# Patient Record
Sex: Male | Born: 1965
Health system: Southern US, Community
[De-identification: ages and names within clinical notes are randomized; demographics above are authoritative.]

## PROBLEM LIST (undated history)

## (undated) DIAGNOSIS — J45909 Unspecified asthma, uncomplicated: Secondary | ICD-10-CM

## (undated) DIAGNOSIS — T7840XA Allergy, unspecified, initial encounter: Secondary | ICD-10-CM

## (undated) DIAGNOSIS — F32A Depression, unspecified: Secondary | ICD-10-CM

## (undated) HISTORY — DX: Depression, unspecified: F32.A

## (undated) HISTORY — DX: Unspecified asthma, uncomplicated: J45.909

## (undated) HISTORY — DX: Allergy, unspecified, initial encounter: T78.40XA

---
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
## 1995-12-24 DIAGNOSIS — S060XAA Concussion with loss of consciousness status unknown, initial encounter: Secondary | ICD-10-CM

## 1995-12-24 DIAGNOSIS — S060X9A Concussion with loss of consciousness of unspecified duration, initial encounter: Secondary | ICD-10-CM

## 1995-12-24 HISTORY — DX: Concussion with loss of consciousness of unspecified duration, initial encounter: S06.0X9A

## 1995-12-24 HISTORY — DX: Concussion with loss of consciousness status unknown, initial encounter: S06.0XAA

## 2004-12-23 HISTORY — PX: CHOLECYSTECTOMY: SHX55

## 2015-12-24 DIAGNOSIS — K5792 Diverticulitis of intestine, part unspecified, without perforation or abscess without bleeding: Secondary | ICD-10-CM

## 2015-12-24 HISTORY — DX: Diverticulitis of intestine, part unspecified, without perforation or abscess without bleeding: K57.92

## 2020-09-25 ENCOUNTER — Encounter: Payer: Self-pay | Admitting: Physician Assistant

## 2020-09-25 ENCOUNTER — Ambulatory Visit (INDEPENDENT_AMBULATORY_CARE_PROVIDER_SITE_OTHER): Payer: 59 | Admitting: Physician Assistant

## 2020-09-25 ENCOUNTER — Other Ambulatory Visit: Payer: Self-pay

## 2020-09-25 VITALS — BP 118/80 | HR 60 | Temp 98.3°F | Resp 20 | Ht 69.0 in | Wt 172.4 lb

## 2020-09-25 DIAGNOSIS — Z23 Encounter for immunization: Secondary | ICD-10-CM

## 2020-09-25 DIAGNOSIS — H9193 Unspecified hearing loss, bilateral: Secondary | ICD-10-CM | POA: Diagnosis not present

## 2020-09-25 DIAGNOSIS — K834 Spasm of sphincter of Oddi: Secondary | ICD-10-CM | POA: Diagnosis not present

## 2020-09-25 DIAGNOSIS — Z9049 Acquired absence of other specified parts of digestive tract: Secondary | ICD-10-CM | POA: Diagnosis not present

## 2020-09-25 DIAGNOSIS — J310 Chronic rhinitis: Secondary | ICD-10-CM

## 2020-09-25 MED ORDER — FLUTICASONE PROPIONATE 50 MCG/ACT NA SUSP: 2.0000 | Freq: Every day | NASAL | 6 refills | Status: AC

## 2020-09-25 NOTE — Progress Notes (Signed)
Patient presents to clinic today to establish care.  Acute Concerns: Patient endorses decreased hearing bilaterally with left greater than right.  Has been ongoing for some time but has been gradually worsening.  Denies any trauma or injury.  Occasionally will notice some mild tinnitus.  Denies ear pressure or popping but does have chronic seasonal rhinitis.  He takes OTC fexofenadine for this.  Patient denies any ear pain, swelling or drainage.  Denies fever, chills, malaise or fatigue.  Does note history of loud noise exposure.  Patient also would like to discuss shingles vaccine today.  Health Maintenance: Immunizations --due for shingles vaccine. Colonoscopy --up-to-date  History reviewed. No pertinent past medical history.  Past Surgical History:  Procedure Laterality Date  . APPENDECTOMY  1984  . CHOLECYSTECTOMY  2006  . concession Bilateral 1997  . diverticulitis Bilateral 2017    Current Outpatient Medications on File Prior to Visit  Medication Sig Dispense Refill  . cholestyramine (QUESTRAN) 4 GM/DOSE powder Take by mouth 3 (three) times daily with meals.    Marland Kitchen escitalopram (LEXAPRO) 20 MG tablet Take 20 mg by mouth daily.    . fexofenadine (ALLEGRA) 60 MG tablet Take 60 mg by mouth 2 (two) times daily.     No current facility-administered medications on file prior to visit.    Not on File  Family History  Problem Relation Age of Onset  . Hearing loss Mother   . Depression Father   . Heart disease Father   . Asthma Sister   . Learning disabilities Sister     Social History   Socioeconomic History  . Marital status: Married    Spouse name: Baxter Hire  . Number of children: 3  . Years of education: Not on file  . Highest education level: Master's degree (e.g., MA, MS, MEng, MEd, MSW, MBA)  Occupational History  . Not on file  Tobacco Use  . Smoking status: Light Tobacco Smoker  . Tobacco comment: occasional holiday smoker   Substance and Sexual Activity    . Alcohol use: Yes    Alcohol/week: 6.0 standard drinks    Types: 2 Glasses of wine, 4 Cans of beer per week    Comment: two glasses of wine a week and 4 to 6 beers a week  . Drug use: Never  . Sexual activity: Yes    Comment: married  Other Topics Concern  . Not on file  Social History Narrative  . Not on file   Social Determinants of Health   Financial Resource Strain:   . Difficulty of Paying Living Expenses: Not on file  Food Insecurity:   . Worried About Programme researcher, broadcasting/film/video in the Last Year: Not on file  . Ran Out of Food in the Last Year: Not on file  Transportation Needs:   . Lack of Transportation (Medical): Not on file  . Lack of Transportation (Non-Medical): Not on file  Physical Activity:   . Days of Exercise per Week: Not on file  . Minutes of Exercise per Session: Not on file  Stress:   . Feeling of Stress : Not on file  Social Connections:   . Frequency of Communication with Friends and Family: Not on file  . Frequency of Social Gatherings with Friends and Family: Not on file  . Attends Religious Services: Not on file  . Active Member of Clubs or Organizations: Not on file  . Attends Banker Meetings: Not on file  . Marital Status: Not  on file  Intimate Partner Violence:   . Fear of Current or Ex-Partner: Not on file  . Emotionally Abused: Not on file  . Physically Abused: Not on file  . Sexually Abused: Not on file   ROS Pertinent ROS are listed in the HPI.   BP 118/80   Pulse 60   Temp 98.3 F (36.8 C) (Temporal)   Resp 20   Ht 5\' 9"  (1.753 m)   Wt 172 lb 6.4 oz (78.2 kg)   SpO2 98%   BMI 25.46 kg/m   Physical Exam Vitals reviewed.  Constitutional:      Appearance: Normal appearance.  HENT:     Head: Normocephalic and atraumatic.     Right Ear: Tympanic membrane and ear canal normal. There is no impacted cerumen.     Left Ear: Tympanic membrane and ear canal normal. There is no impacted cerumen.  Eyes:      Conjunctiva/sclera: Conjunctivae normal.     Pupils: Pupils are equal, round, and reactive to light.  Cardiovascular:     Rate and Rhythm: Normal rate and regular rhythm.     Pulses: Normal pulses.     Heart sounds: Normal heart sounds.  Pulmonary:     Effort: Pulmonary effort is normal.     Breath sounds: Normal breath sounds.  Abdominal:     General: Bowel sounds are normal.     Palpations: Abdomen is soft.  Musculoskeletal:     Cervical back: Neck supple.  Neurological:     General: No focal deficit present.     Mental Status: He is alert and oriented to person, place, and time.  Psychiatric:        Mood and Affect: Mood normal.     Assessment/Plan: 1. Bilateral hearing loss, unspecified hearing loss type Examination unremarkable today.  Ambulatory referral to audiology placed for further evaluation. - Ambulatory referral to Audiology  2. Chronic rhinitis We will add on Flonase daily Allegra.  Follow-up as needed if symptoms not improving. - fluticasone (FLONASE) 50 MCG/ACT nasal spray; Place 2 sprays into both nostrils daily.  Dispense: 16 g; Refill: 6  3. S/P cholecystectomy 4. Sphincter of Oddi dysfunction Needs local specialist.  Stable on nighttime use of cholestyramine.  Following balanced and bland diet.  Referral to GI placed. - Ambulatory referral to Gastroenterology  5. Need for shingles vaccine Shingles vaccine given today in office.  Discussed timing for second shingles vaccine.  This visit occurred during the SARS-CoV-2 public health emergency.  Safety protocols were in place, including screening questions prior to the visit, additional usage of staff PPE, and extensive cleaning of exam room while observing appropriate contact time as indicated for disinfecting solutions.     , PA-C

## 2020-09-25 NOTE — Patient Instructions (Signed)
Please continue chronic medications as directed, adding on the Flonase spray once daily for allergies and fluid behind the ear drums.   You will be contacted to set up evaluation with Audiology. If you do not hear from them within a week, please let me know.   You will also be contacted by Gastroenterology to be established with a local provider giving your history.  Your first Shingrix was given today. The next should be given between 2-6 months from now. If you wait longer the series has to be restarted.   It was very nice meeting you today. Welcome to [facility]!

## 2020-10-31 ENCOUNTER — Telehealth: Payer: Self-pay | Admitting: Physician Assistant

## 2020-10-31 DIAGNOSIS — Z9049 Acquired absence of other specified parts of digestive tract: Secondary | ICD-10-CM

## 2020-10-31 DIAGNOSIS — K834 Spasm of sphincter of Oddi: Secondary | ICD-10-CM

## 2020-10-31 NOTE — Telephone Encounter (Signed)
Pt called in asking for Cholestyramine, not the packets because they cost to much, he would like this to be sent to the [facility] on [address].

## 2020-11-01 MED ORDER — CHOLESTYRAMINE 4 GM/DOSE PO POWD: 4.0000 g | Freq: Every day | ORAL | 1 refills | Status: DC

## 2020-11-01 NOTE — Telephone Encounter (Signed)
Patient was referral to GI for follow-up regarding sphincter of Oddi dysfunction and further management.  I am happy to send in 1 fill of medication until he sees specialist.  Looks like the powder itself (not in individualized packets) is not covered by his insurance so I am not sure how much this will cost. He should let us know if more expensive for him so we can resubmit the packets.

## 2020-11-02 MED ORDER — CHOLESTYRAMINE LIGHT 4 G PO POWD: ORAL | 0 refills | Status: DC

## 2020-11-02 NOTE — Telephone Encounter (Signed)
Looks like GI called to schedule patient for an appointment. [facility] [phone]. Medication refilled to the pharmacy

## 2020-11-02 NOTE — Telephone Encounter (Signed)
Left a detailed message on patient VM. Will send in the powder and advised patient to notify us if cost of medication is too expensive.

## 2020-11-02 NOTE — Addendum Note (Signed)
Addended by: Con Memos on: 11/02/2020 03:10 PM   Modules accepted: Orders

## 2020-11-06 ENCOUNTER — Encounter: Payer: Self-pay | Admitting: Physician Assistant

## 2020-11-07 ENCOUNTER — Encounter: Payer: Self-pay | Admitting: Physician Assistant

## 2020-11-28 ENCOUNTER — Ambulatory Visit: Payer: 59 | Admitting: Physician Assistant

## 2021-01-02 ENCOUNTER — Encounter: Payer: Self-pay | Admitting: Physician Assistant

## 2021-01-02 ENCOUNTER — Other Ambulatory Visit (INDEPENDENT_AMBULATORY_CARE_PROVIDER_SITE_OTHER): Payer: 59

## 2021-01-02 ENCOUNTER — Ambulatory Visit (INDEPENDENT_AMBULATORY_CARE_PROVIDER_SITE_OTHER): Payer: 59 | Admitting: Physician Assistant

## 2021-01-02 VITALS — BP 124/74 | HR 62 | Ht 68.0 in | Wt 174.0 lb

## 2021-01-02 DIAGNOSIS — R109 Unspecified abdominal pain: Secondary | ICD-10-CM | POA: Diagnosis not present

## 2021-01-02 DIAGNOSIS — K9089 Other intestinal malabsorption: Secondary | ICD-10-CM | POA: Diagnosis not present

## 2021-01-02 DIAGNOSIS — K834 Spasm of sphincter of Oddi: Secondary | ICD-10-CM

## 2021-01-02 LAB — COMPREHENSIVE METABOLIC PANEL
ALT: 25 U/L (ref 0–53)
AST: 27 U/L (ref 0–37)
Albumin: 4.5 g/dL (ref 3.5–5.2)
Alkaline Phosphatase: 58 U/L (ref 39–117)
BUN: 15 mg/dL (ref 6–23)
CO2: 27 mEq/L (ref 19–32)
Calcium: 9.5 mg/dL (ref 8.4–10.5)
Chloride: 103 mEq/L (ref 96–112)
Creatinine, Ser: 0.99 mg/dL (ref 0.40–1.50)
GFR: 86.87 mL/min (ref >60.00)
Glucose, Bld: 90 mg/dL (ref 70–99)
Potassium: 4.1 mEq/L (ref 3.5–5.1)
Sodium: 136 mEq/L (ref 135–145)
Total Bilirubin: 1 mg/dL (ref 0.2–1.2)
Total Protein: 7.1 g/dL (ref 6.0–8.3)

## 2021-01-02 LAB — LIPASE: Lipase: 29 U/L (ref 11.0–59.0)

## 2021-01-02 LAB — CBC WITH DIFFERENTIAL/PLATELET
Basophils Absolute: 0.1 10*3/uL (ref 0.0–0.1)
Basophils Relative: 0.8 % (ref 0.0–3.0)
Eosinophils Absolute: 0.3 10*3/uL (ref 0.0–0.7)
Eosinophils Relative: 4 % (ref 0.0–5.0)
HCT: 46.5 % (ref 39.0–52.0)
Hemoglobin: 16.1 g/dL (ref 13.0–17.0)
Lymphocytes Relative: 21 % (ref 12.0–46.0)
Lymphs Abs: 1.3 10*3/uL (ref 0.7–4.0)
MCHC: 34.7 g/dL (ref 30.0–36.0)
MCV: 89.6 fl (ref 78.0–100.0)
Monocytes Absolute: 0.7 10*3/uL (ref 0.1–1.0)
Monocytes Relative: 11.1 % (ref 3.0–12.0)
Neutro Abs: 4 10*3/uL (ref 1.4–7.7)
Neutrophils Relative %: 63.1 % (ref 43.0–77.0)
Platelets: 225 10*3/uL (ref 150.0–400.0)
RBC: 5.19 Mil/uL (ref 4.22–5.81)
RDW: 12.9 % (ref 11.5–15.5)
WBC: 6.3 10*3/uL (ref 4.0–10.5)

## 2021-01-02 LAB — AMYLASE: Amylase: 48 U/L (ref 27–131)

## 2021-01-02 MED ORDER — HYOSCYAMINE SULFATE 0.125 MG SL SUBL: 0.1250 mg | SUBLINGUAL_TABLET | Freq: Four times a day (QID) | SUBLINGUAL | 3 refills | Status: AC | PRN

## 2021-01-02 MED ORDER — CHOLESTYRAMINE 4 GM/DOSE PO POWD: 4.0000 g | Freq: Every day | ORAL | 3 refills | Status: DC

## 2021-01-02 NOTE — Patient Instructions (Signed)
If you are age 54 or older, your body mass index should be between 23-30. Your Body mass index is 26.46 kg/m. If this is out of the aforementioned range listed, please consider follow up with your Primary Care Provider.  If you are age 81 or younger, your body mass index should be between 19-25. Your Body mass index is 26.46 kg/m. If this is out of the aformentioned range listed, please consider follow up with your Primary Care Provider.   Your provider has requested that you go to the basement level for lab work before leaving today. Press "B" on the elevator. The lab is located at the first door on the left as you exit the elevator.  Due to recent changes in healthcare laws, you may see the results of your imaging and laboratory studies on MyChart before your provider has had a chance to review them.  We understand that in some cases there may be results that are confusing or concerning to you. Not all laboratory results come back in the same time frame and the provider may be waiting for multiple results in order to interpret others.  Please give [place] 48 hours in order for your provider to thoroughly review all the results before contacting the office for clarification of your results.   Thank you for choosing me and [facility].  Hyacinth Meeker, PA-C

## 2021-01-02 NOTE — Progress Notes (Signed)
Attending Physician's Attestation   I have reviewed the chart.   I agree with the Advanced Practitioner's note, impression, and recommendations with any updates as below. May be reasonable to also obtain a fecal elastase at some point in time as well as consider small intestine bacterial overgrowth breath testing.  Reasonable to continue cholestyramine in the setting of bile salt diarrhea.  Recommend some sort of abdominal imaging including either an abdominal ultrasound to evaluate if there is any bile duct dilation or if he is not had a CT scan at some point in time recently should have that done in the setting of his "chronic right upper quadrant abdominal pain".  We can see how his recent endoscopic evaluations have looked but if he has not had an upper endoscopy or endoscopic ultrasound at some point in time it probably is reasonable for [place] to consider an endoscopic ultrasound.  Recommend considering being reseen in clinic within the next 2 to 3 months depending on how aggressive patient wants to be as in regards to the fecal elastase and SIBO breath testing.   Corliss Parish, MD [facility] Advanced Endoscopy Office # [phone]

## 2021-01-02 NOTE — Progress Notes (Signed)
Chief Complaint: History of sphincter of Oddi dysfunction and bile salt induced diarrhea  HPI:    Mr. Martin Taylor is a 55 year old Caucasian male with a past medical history as listed below, who was referred to me by Waldon Merl, PA-C for a complaint of history of sphincter of Oddi dysfunction and bile salt induced diarrhea.      Today, the patient tells me that he had his gallbladder out in 2008 and at that time they damaged his sphincter of Oddi.  Describes this is why he has had bile salt diarrhea since then.  He manages this with Cholestyramine 4 g typically when going to bed.  Occasionally he will have breakthrough "yellow bile" diarrhea and will take a little bit more of the powder.  Tells me he seems to manage this okay.  Does explain though that over the past month or so he has had some new symptoms of right upper quadrant/upper abdominal cramping which results in a few more loose stools in the morning "they are not that yellow color, so I do not think it is the bile".  Describes this has gotten some better over the past couple of days but he definitely felt this for at least 3 to 4 weeks over the holidays.  Does describe being clinically diagnosed with depression last year and being started on medicine for this, Lexapro, and wonders if this could have given him some excess diarrhea.    Also continues with chronic right upper quadrant abdominal discomfort which is very minimal and "I just live with it".    Does describe that his father died ultimately of pancreatitis.  Explains that his previous GI physician used to check his lipase and amylase every year or so just to ensure that there "was nothing going on".  Reports his last colonoscopy 3 years ago in [place], unsure if there were polyps.    Denies fever, chills or blood in his stool.  Past Medical History:  Diagnosis Date  . Allergy   . Asthma   . Blood transfusion without reported diagnosis 1984  . Concussion 1997  . Depression    . Diverticulitis 2017    Past Surgical History:  Procedure Laterality Date  . APPENDECTOMY  1984  . CHOLECYSTECTOMY  2006    Current Outpatient Medications  Medication Sig Dispense Refill  . cholestyramine light 4 g POWD Take 4 G of powder by mouth at bedtime 201.6 g 0  . diphenhydrAMINE (BENADRYL ALLERGY) 25 MG tablet Take 12.5 mg by mouth every 6 (six) hours as needed.    Marland Kitchen escitalopram (LEXAPRO) 20 MG tablet Take 20 mg by mouth daily.    . fexofenadine (ALLEGRA) 60 MG tablet Take 60 mg by mouth 2 (two) times daily.    . fluticasone (FLONASE) 50 MCG/ACT nasal spray Place 2 sprays into both nostrils daily. 16 g 6  . hyoscyamine (LEVSIN SL) 0.125 MG SL tablet Place 1 tablet (0.125 mg total) under the tongue every 6 (six) hours as needed. 15 tablet 3  . Melatonin 3-2 MG TABS Take 2 mg by mouth daily.    . Multiple Vitamin (MULTIVITAMIN ADULT PO) Take by mouth.    . cholestyramine (QUESTRAN) 4 GM/DOSE powder Take 1 packet (4 g total) by mouth at bedtime. 90 packet 3   No current facility-administered medications for this visit.    Allergies as of 01/02/2021 - Review Complete 01/02/2021  Allergen Reaction Noted  . Codeine  09/25/2020    Family History  Problem Relation Age of Onset  . Hearing loss Mother   . AAA (abdominal aortic aneurysm) Mother   . Breast cancer Mother   . Depression Father   . Heart disease Father   . Throat cancer Father        dx and then when re-checked it was gone!!  . Asthma Sister   . Learning disabilities Sister   . Colon cancer Neg Hx     Social History   Socioeconomic History  . Marital status: Married    Spouse name: Baxter Hire  . Number of children: 3  . Years of education: Not on file  . Highest education level: Master's degree (e.g., MA, MS, MEng, MEd, MSW, MBA)  Occupational History  . Occupation: Teacher/trainer/self-contracting  Tobacco Use  . Smoking status: Light Tobacco Smoker    Types: Cigars  . Smokeless tobacco: Never Used   . Tobacco comment: occasional holiday smoker   Vaping Use  . Vaping Use: Never used  Substance and Sexual Activity  . Alcohol use: Yes    Alcohol/week: 6.0 standard drinks    Types: 2 Glasses of wine, 4 Cans of beer per week    Comment: two glasses of wine a week and 4 to 6 beers a week  . Drug use: Never  . Sexual activity: Yes    Comment: married  Other Topics Concern  . Not on file  Social History Narrative  . Not on file   Social Determinants of Health   Financial Resource Strain: Not on file  Food Insecurity: Not on file  Transportation Needs: Not on file  Physical Activity: Not on file  Stress: Not on file  Social Connections: Not on file  Intimate Partner Violence: Not on file    Review of Systems:    Constitutional: No weight loss, fever or chills Skin: No rash  Cardiovascular: No chest pain Respiratory: No SOB  Gastrointestinal: See HPI and otherwise negative Genitourinary: No dysuria  Neurological: No headache, dizziness or syncope Musculoskeletal: No new muscle or joint pain Hematologic: No bleeding  Psychiatric: +depression   Physical Exam:  Vital signs: BP 124/74   Pulse 62   Ht 5\' 8"  (1.727 m)   Wt 174 lb (78.9 kg)   BMI 26.46 kg/m   Constitutional:   Pleasant Caucasian male appears to be in NAD, Well developed, Well nourished, alert and cooperative Head:  Normocephalic and atraumatic. Eyes:   PEERL, EOMI. No icterus. Conjunctiva pink. Ears:  Normal auditory acuity. Neck:  Supple Throat: Oral cavity and pharynx without inflammation, swelling or lesion.  Respiratory: Respirations even and unlabored. Lungs clear to auscultation bilaterally.   No wheezes, crackles, or rhonchi.  Cardiovascular: Normal S1, S2. No MRG. Regular rate and rhythm. No peripheral edema, cyanosis or pallor.  Gastrointestinal:  Soft, nondistended, mild RUQ ttp. No rebound or guarding. Normal bowel sounds. No appreciable masses or hepatomegaly. Rectal:  Not performed.  Msk:   Symmetrical without gross deformities. Without edema, no deformity or joint abnormality.  Neurologic:  Alert and  oriented x4;  grossly normal neurologically.  Skin:   Dry and intact without significant lesions or rashes. Psychiatric:  Demonstrates good judgement and reason without abnormal affect or behaviors.  No recent labs or imaging.  Assessment: 1.  History of sphincter of Oddi dysfunction: Patient describes ever since cholecystectomy in 2008, apparently monitored with lipase and amylase every year 2.  Bile salt diarrhea: Better with cholestyramine nightly 3.  Abdominal cramping: Consider relation to depression and possible  IBS versus other 4.  Screening for colorectal cancer: Patient reports colonoscopy 3 years ago in [place], he is not sure if he had polyps  Plan: 1.  Patient has not had any recent labs.  Updated CBC, CMP, amylase and lipase 2.  Could consider imaging going forward if right upper quadrant pain increases at all. 3.  Refilled cholestyramine 4 g nightly #90 with 3 refills. 4.  Prescribed hyoscyamine sulfate 0.125 mg sublingual tabs every 4-6 hours as needed for abdominal cramping #15 with 2 refills.  Patient can try using this for recurrent abdominal cramping pain 5.  Patient will call and let us know if any of his symptoms worsen. 6.  Requesting records from [place] in regards to last colonoscopy 7.  Patient was assigned to Dr. Meridee Score today as he has some biliary history and Dr. Meridee Score has biliary experience.  Patient will follow in clinic yearly or sooner if necessary.  Hyacinth Meeker, PA-C [facility] 01/02/2021, 2:02 PM  Cc: Waldon Merl, PA-C   After time the patient's appointment around 2:22 PM and 01/02/2021 I received records from [facility] in [place].  06/04/2016 colonoscopy for altered bowel habits with diarrhea, generalized abdominal pain and bloating, history of diverticulitis and heme positive stools.  There was a finding  of a 3 mm sessile sigmoid colon polyp, mild diffuse diverticulosis without diverticulitis and mild internal hemorrhoids.  Pathology showed a hyperplastic polyp and random colonic mucosa with no diagnostic abnormality.  Given colonoscopy above will place patient in recall for a colonoscopy 06/04/2026.  Lynda Rainwater, PA-C.

## 2021-01-03 ENCOUNTER — Telehealth: Payer: Self-pay

## 2021-01-03 ENCOUNTER — Other Ambulatory Visit: Payer: Self-pay

## 2021-01-03 DIAGNOSIS — G8929 Other chronic pain: Secondary | ICD-10-CM

## 2021-01-03 NOTE — Telephone Encounter (Signed)
Pt called stating that [place] on 01/11/21 will not work for him due to work conflict. He stated that the week of 01/15/21 he is more open. Pls call him.

## 2021-01-03 NOTE — Telephone Encounter (Signed)
Can you call patient. Please tell him that Dr. Meridee Score reviewed his visit and recommends an abdominal ultrasound to check on bile duct in setting of "chronic RUQ pain". Then he would like to see him in clinic in 2-3 mos if you can arrange for that as well.  Thanks-JLL

## 2021-01-03 NOTE — Progress Notes (Signed)
Can you call patient. Please tell him that Dr. Mansouraty reviewed his visit and recommends an abdominal ultrasound to check on bile duct in setting of "chronic RUQ pain". Then he would like to see him in clinic in 2-3 mos if you can arrange for that as well.  Thanks-JLL  

## 2021-01-03 NOTE — Telephone Encounter (Signed)
Patient has been scheduled for an abdominal US at [facility] on Thursday, 01/11/21 at 10:00 AM, patient is to arrive at 9:30 AM. NPO after midnight.   Patient has been scheduled for a follow up with Dr. Meridee Score on Tuesday, 03/06/2021 at 11:30 AM.   Spoke with patient in regards to scheduled appointments and restrictions for [place]. Answered all of patient's questions, he verbalized understanding of all information and had no further concerns at the end of the call.

## 2021-01-03 NOTE — Telephone Encounter (Signed)
-----   Message from [name], [name] sent at 01/03/2021  8:34 AM EST -----   ----- Message ----- From: Lemar Lofty., MD Sent: 01/02/2021   5:57 PM EST To: [name], PA    ----- Message ----- From: [name], [name] Sent: 01/02/2021   2:25 PM EST To: Lemar Lofty., MD

## 2021-01-03 NOTE — Telephone Encounter (Signed)
Spoke with patient and provided him with the radiology scheduling number so that he can reschedule [place] at his convenience. Patient had no concerns at the end of the call.

## 2021-01-11 ENCOUNTER — Ambulatory Visit (HOSPITAL_COMMUNITY): Payer: 59

## 2021-03-06 ENCOUNTER — Ambulatory Visit: Payer: 59 | Admitting: Gastroenterology

## 2021-07-08 ENCOUNTER — Telehealth: Payer: Self-pay | Admitting: Family Medicine

## 2021-07-08 ENCOUNTER — Other Ambulatory Visit: Payer: Self-pay

## 2021-07-08 ENCOUNTER — Other Ambulatory Visit: Payer: Self-pay | Admitting: Family Medicine

## 2021-07-08 ENCOUNTER — Emergency Department (HOSPITAL_BASED_OUTPATIENT_CLINIC_OR_DEPARTMENT_OTHER)
Admission: EM | Admit: 2021-07-08 | Discharge: 2021-07-08 | Disposition: A | Discharge status: Home / Self Care | Payer: 59 | Attending: Emergency Medicine | Admitting: Emergency Medicine

## 2021-07-08 DIAGNOSIS — F17290 Nicotine dependence, other tobacco product, uncomplicated: Secondary | ICD-10-CM | POA: Insufficient documentation

## 2021-07-08 DIAGNOSIS — R059 Cough, unspecified: Secondary | ICD-10-CM | POA: Diagnosis present

## 2021-07-08 DIAGNOSIS — U071 COVID-19: Secondary | ICD-10-CM

## 2021-07-08 DIAGNOSIS — J45909 Unspecified asthma, uncomplicated: Secondary | ICD-10-CM | POA: Diagnosis not present

## 2021-07-08 MED ORDER — DOXYCYCLINE HYCLATE 100 MG PO CAPS: 100.0000 mg | ORAL_CAPSULE | Freq: Two times a day (BID) | ORAL | 0 refills | Status: DC

## 2021-07-08 MED ORDER — MOLNUPIRAVIR EUA 200MG CAPSULE: 4.0000 | ORAL_CAPSULE | Freq: Two times a day (BID) | ORAL | 0 refills | Status: AC

## 2021-07-08 MED ORDER — NIRMATRELVIR/RITONAVIR (PAXLOVID)TABLET: 3.0000 | ORAL_TABLET | Freq: Two times a day (BID) | ORAL | 0 refills | Status: AC

## 2021-07-08 MED ORDER — BENZONATATE 100 MG PO CAPS: 100.0000 mg | ORAL_CAPSULE | Freq: Three times a day (TID) | ORAL | 0 refills | Status: DC

## 2021-07-08 MED ORDER — ONDANSETRON 4 MG PO TBDP: ORAL_TABLET | ORAL | 0 refills | Status: DC

## 2021-07-08 NOTE — Discharge Instructions (Addendum)
Take tylenol 2 pills 4 times a day and motrin 4 pills 3 times a day.  Drink plenty of fluids.  Return for worsening shortness of breath, headache, confusion. Follow up with your family doctor.   Antibiotics do not typically help with COVID.  Since you have asthma I did prescribe you paxlovid, as we discussed this seems to be beneficial in keeping people out of the hospital though  was tried on an earlier strain of the virus.  Please return for difficulty breathing.

## 2021-07-08 NOTE — ED Provider Notes (Signed)
[facility] EMERGENCY DEPARTMENT Provider Note   CSN: [id number] Arrival date & time: 07/08/21  1746     History Chief Complaint  Patient presents with   Cough    Covid+    Martin Taylor is a 55 y.o. male.  55 yo M with a chief complaints of having COVID and having a pulse ox found that his oxygen was in the 70s at home.  Patient has been having a cough usually worse in the morning finds a productive and then improves throughout the day.  He had some nausea and vomiting and diarrhea at the onset but it resolved.  Denies any significant difficulty breathing currently.  Has a history of asthma.  The history is provided by the patient.  Cough Associated symptoms: no chest pain, no chills, no eye discharge, no fever, no headaches, no myalgias, no rash and no shortness of breath   Illness Severity:  Moderate Onset quality:  Gradual Duration:  2 days Timing:  Constant Progression:  Worsening Chronicity:  New Associated symptoms: cough   Associated symptoms: no abdominal pain, no chest pain, no congestion, no diarrhea, no fever, no headaches, no myalgias, no rash, no shortness of breath and no vomiting       Past Medical History:  Diagnosis Date   Allergy    Asthma    Blood transfusion without reported diagnosis 1984   Concussion 1997   Depression    Diverticulitis 2017    There are no problems to display for this patient.   Past Surgical History:  Procedure Laterality Date   APPENDECTOMY  1984   CHOLECYSTECTOMY  2006       Family History  Problem Relation Age of Onset   Hearing loss Mother    AAA (abdominal aortic aneurysm) Mother    Breast cancer Mother    Depression Father    Heart disease Father    Throat cancer Father        dx and then when re-checked it was gone!!   Asthma Sister    Learning disabilities Sister    Colon cancer Neg Hx     Social History   Tobacco Use   Smoking status: Light Smoker    Types: Cigars   Smokeless  tobacco: Never   Tobacco comments:    occasional holiday smoker   Vaping Use   Vaping Use: Never used  Substance Use Topics   Alcohol use: Yes    Alcohol/week: 6.0 standard drinks    Types: 2 Glasses of wine, 4 Cans of beer per week    Comment: two glasses of wine a week and 4 to 6 beers a week   Drug use: Never    Home Medications Prior to Admission medications   Medication Sig Start Date End Date Taking? Authorizing Provider  benzonatate (TESSALON) 100 MG capsule Take 1 capsule (100 mg total) by mouth every 8 (eight) hours. 07/08/21  Yes Melene Plan, DO  doxycycline (VIBRAMYCIN) 100 MG capsule Take 1 capsule (100 mg total) by mouth 2 (two) times daily. One po bid x 7 days 07/08/21  Yes Melene Plan, DO  nirmatrelvir/ritonavir EUA (PAXLOVID) TABS Take 3 tablets by mouth 2 (two) times daily for 5 days. Take nirmatrelvir (150 mg) two tablets twice daily for 5 days and ritonavir (100 mg) one tablet twice daily for 5 days. 07/08/21 07/13/21 Yes Melene Plan, DO  ondansetron (ZOFRAN ODT) 4 MG disintegrating tablet 4mg  ODT q4 hours prn nausea/vomit 07/08/21  Yes 07/10/21, DO  cholestyramine (QUESTRAN) 4 GM/DOSE powder Take 1 packet (4 g total) by mouth at bedtime. 01/02/21   [name], PA  cholestyramine light 4 g POWD Take 4 G of powder by mouth at bedtime 11/02/20   Waldon Merl, PA-C  diphenhydrAMINE (BENADRYL ALLERGY) 25 MG tablet Take 12.5 mg by mouth every 6 (six) hours as needed.    [provider]  escitalopram (LEXAPRO) 20 MG tablet Take 20 mg by mouth daily.    [provider]  fexofenadine (ALLEGRA) 60 MG tablet Take 60 mg by mouth 2 (two) times daily.    [provider]  fluticasone (FLONASE) 50 MCG/ACT nasal spray Place 2 sprays into both nostrils daily. 09/25/20   Waldon Merl, PA-C  hyoscyamine (LEVSIN SL) 0.125 MG SL tablet Place 1 tablet (0.125 mg total) under the tongue every 6 (six) hours as needed. 01/02/21   [name], PA   Melatonin 3-2 MG TABS Take 2 mg by mouth daily.    [provider]  molnupiravir EUA 200 mg CAPS Take 4 capsules (800 mg total) by mouth 2 (two) times daily for 5 days. 07/08/21 07/13/21  Donato Schultz, DO  Multiple Vitamin (MULTIVITAMIN ADULT PO) Take by mouth.    [provider]    Allergies    Codeine  Review of Systems   Review of Systems  Constitutional:  Negative for chills and fever.  HENT:  Negative for congestion and facial swelling.   Eyes:  Negative for discharge and visual disturbance.  Respiratory:  Positive for cough. Negative for shortness of breath.   Cardiovascular:  Negative for chest pain and palpitations.  Gastrointestinal:  Negative for abdominal pain, diarrhea and vomiting.  Musculoskeletal:  Negative for arthralgias and myalgias.  Skin:  Negative for color change and rash.  Neurological:  Negative for tremors, syncope and headaches.  Psychiatric/Behavioral:  Negative for confusion and dysphoric mood.    Physical Exam Updated Vital Signs BP 126/88 (BP Location: Right Arm)   Pulse (!) 58   Temp 98.8 F (37.1 C) (Oral)   Resp 16   Ht 5\' 8"  (1.727 m)   Wt 78 kg   SpO2 100%   BMI 26.15 kg/m   Physical Exam Vitals and nursing note reviewed.  Constitutional:      Appearance: He is well-developed.  HENT:     Head: Normocephalic and atraumatic.  Eyes:     Pupils: Pupils are equal, round, and reactive to light.  Neck:     Vascular: No JVD.  Cardiovascular:     Rate and Rhythm: Normal rate and regular rhythm.     Heart sounds: No murmur heard.   No friction rub. No gallop.  Pulmonary:     Effort: No respiratory distress.     Breath sounds: No wheezing.  Abdominal:     General: There is no distension.     Tenderness: There is no abdominal tenderness. There is no guarding or rebound.  Musculoskeletal:        General: Normal range of motion.     Cervical back: Normal range of motion and neck supple.  Skin:    Coloration:  Skin is not pale.     Findings: No rash.  Neurological:     Mental Status: He is alert and oriented to person, place, and time.  Psychiatric:        Behavior: Behavior normal.    ED Results / Procedures / Treatments   Labs (all labs  ordered are listed, but only abnormal results are displayed) Labs Reviewed - No data to display  EKG None  Radiology No results found.  Procedures Procedures   Medications Ordered in ED Medications - No data to display  ED Course  I have reviewed the triage vital signs and the nursing notes.  Pertinent labs & imaging results that were available during my care of the patient were reviewed by me and considered in my medical decision making (see chart for details).    MDM Rules/Calculators/A&P                          55 yo M with a chief complaints of having COVID.  The patient had a oxygen saturation in the 70s at home.  Here to 100% while ambulating.  Clear lung sounds for me. HX of asthma will start on paxlovid.    The patient has a doctor who was a friend who was telling him that he needs to be evaluated for bacterial pneumonia.  I discussed with him my understanding of the research on this is that it is incredibly rare and unlikely to be beneficial.  He understands this but is requesting an antibiotic just in case things get worse.  We will have him follow-up with a family doctor, unfortunately he does not have one in the area I will refer him to the doctor's office upstairs.  6:37 PM:  I have discussed the diagnosis/risks/treatment options with the patient and believe the pt to be eligible for discharge home to follow-up with PCP. We also discussed returning to the ED immediately if new or worsening sx occur. We discussed the sx which are most concerning (e.g., sudden worsening pain, fever, inability to tolerate by mouth) that necessitate immediate return. Medications administered to the patient during their visit and any new prescriptions  provided to the patient are listed below.  Medications given during this visit Medications - No data to display   The patient appears reasonably screen and/or stabilized for discharge and I doubt any other medical condition or other [facility] requiring further screening, evaluation, or treatment in the ED at this time prior to discharge.     Final Clinical Impression(s) /   ED Diagnoses Final diagnoses:  COVID-19 virus infection    Rx / DC Orders ED Discharge Orders          Ordered    ondansetron (ZOFRAN ODT) 4 MG disintegrating tablet        07/08/21 1824    benzonatate (TESSALON) 100 MG capsule  Every 8 hours        07/08/21 1824    nirmatrelvir/ritonavir EUA (PAXLOVID) TABS  2 times daily        07/08/21 1824    doxycycline (VIBRAMYCIN) 100 MG capsule  2 times daily        07/08/21 1824             Melene Plan, DO 07/08/21 1837

## 2021-07-08 NOTE — ED Triage Notes (Signed)
Pt reports +covid test yesterday. Sx x 3 days. Reports his home pulse ox was reading in 70s and 80s. Also he is coughing up "gray chunky stuff". O2 sats 97% on room air after walking from lobby to room 4. Pt alert. NAD

## 2021-07-08 NOTE — ED Notes (Signed)
ED Provider at bedside. 

## 2021-07-08 NOTE — Telephone Encounter (Signed)
Pt had + home covid test today.  His symptoms started Thursday --- diarrhea, sore throat, fever and body aches and cough Pulse ox 94-95% has dropped to 87-89% but comes back up.   Molnupirvir called into pharmacy.  Pt advised to go to ER if he becomes sob or if PO < 95% and states below 95%   pt understands

## 2021-07-08 NOTE — ED Notes (Signed)
Ambulated from lobby to room 4, SpO2 95-98%, HR 60-70, no DOE noted, dry NP cough in room

## 2021-10-08 MED ORDER — CHOLESTYRAMINE 4 GM/DOSE PO POWD: 4.0000 g | Freq: Every day | ORAL | 3 refills | Status: DC

## 2021-10-09 DIAGNOSIS — R69 Illness, unspecified: Secondary | ICD-10-CM | POA: Diagnosis not present

## 2021-10-30 DIAGNOSIS — R69 Illness, unspecified: Secondary | ICD-10-CM | POA: Diagnosis not present

## 2021-11-26 ENCOUNTER — Other Ambulatory Visit: Payer: Self-pay

## 2021-11-26 ENCOUNTER — Ambulatory Visit (HOSPITAL_BASED_OUTPATIENT_CLINIC_OR_DEPARTMENT_OTHER)
Admission: RE | Admit: 2021-11-26 | Discharge: 2021-11-26 | Disposition: A | Discharge status: Home / Self Care | Payer: 59 | Source: Ambulatory Visit | Attending: Family Medicine | Admitting: Family Medicine

## 2021-11-26 ENCOUNTER — Ambulatory Visit (INDEPENDENT_AMBULATORY_CARE_PROVIDER_SITE_OTHER): Payer: 59 | Admitting: Family Medicine

## 2021-11-26 VITALS — BP 118/84 | HR 72 | Temp 97.7°F | Resp 16 | Wt 180.8 lb

## 2021-11-26 DIAGNOSIS — J45909 Unspecified asthma, uncomplicated: Secondary | ICD-10-CM | POA: Diagnosis not present

## 2021-11-26 DIAGNOSIS — R059 Cough, unspecified: Secondary | ICD-10-CM

## 2021-11-26 DIAGNOSIS — U071 COVID-19: Secondary | ICD-10-CM | POA: Diagnosis not present

## 2021-11-26 DIAGNOSIS — R062 Wheezing: Secondary | ICD-10-CM | POA: Diagnosis not present

## 2021-11-26 MED ORDER — MONTELUKAST SODIUM 10 MG PO TABS: 10.0000 mg | ORAL_TABLET | Freq: Every evening | ORAL | 3 refills | Status: DC | PRN

## 2021-11-26 MED ORDER — PROMETHAZINE-DM 6.25-15 MG/5ML PO SYRP: 2.5000 mL | ORAL_SOLUTION | Freq: Two times a day (BID) | ORAL | 0 refills | Status: DC | PRN

## 2021-11-26 MED ORDER — METHYLPREDNISOLONE 4 MG PO TABS: ORAL_TABLET | ORAL | 0 refills | Status: DC

## 2021-11-26 MED ORDER — METHYLPREDNISOLONE SODIUM SUCC 125 MG IJ SOLR: 125.0000 mg | Freq: Once | INTRAMUSCULAR | 0 refills | Status: AC

## 2021-11-26 MED ORDER — ALBUTEROL SULFATE HFA 108 (90 BASE) MCG/ACT IN AERS: 1.0000 | INHALATION_SPRAY | Freq: Four times a day (QID) | RESPIRATORY_TRACT | 2 refills | Status: AC | PRN

## 2021-11-26 MED ORDER — CEFDINIR 300 MG PO CAPS: 300.0000 mg | ORAL_CAPSULE | Freq: Two times a day (BID) | ORAL | 0 refills | Status: AC

## 2021-11-26 NOTE — Progress Notes (Signed)
Subjective:   By signing my name below, I, Martin Taylor, attest that this documentation has been prepared under the direction and in the presence of Martin Canary, MD. 11/26/2021    Patient ID: Martin Taylor, male    DOB: February 26, 1966, 55 y.o.   MRN: [id number]  Chief Complaint  Patient presents with   Nasal Congestion   Cough   Sore Throat    Cough Associated symptoms include chest pain, headaches and a sore throat. Pertinent negatives include no eye redness, fever, myalgias, rash or shortness of breath.  Sore Throat  Associated symptoms include coughing and headaches. Pertinent negatives include no abdominal pain, congestion or shortness of breath.   Patient is in today for an office visit.  He has a history of asthma. It is exercise-inducing and more responsive to allergens.  He had Covid-19 in July 2022 and feels like his lungs never recovered. Was travelling in [place] and took [place] tests on Tuesday and Thursday. Tests were negative. Was exposed to RSV in [place]. Symptoms include chest pain with coughing, sinus pressure and congestion in chest. He has been using zinc and vitamin C and been keeping hydrated. Reports having trouble sleeping and there is rattling in chest. Has productive cough with yellow sputum and occasional headaches. No fevers and chills.   He has received the flu vaccine. He has 4 Covid-19 vaccines at this time. Last vaccine was in August 2022.   He is not a smoker or a heavy drinker.    Past Medical History:  Diagnosis Date   Allergy    Asthma    Blood transfusion without reported diagnosis 1984   Concussion 1997   Depression    Diverticulitis 2017    Past Surgical History:  Procedure Laterality Date   APPENDECTOMY  1984   CHOLECYSTECTOMY  2006    Family History  Problem Relation Age of Onset   Hearing loss Mother    AAA (abdominal aortic aneurysm) Mother    Breast cancer Mother    Depression Father    Heart disease Father    Throat  cancer Father        dx and then when re-checked it was gone!!   Asthma Sister    Learning disabilities Sister    Colon cancer Neg Hx     Social History   Socioeconomic History   Marital status: Married    Spouse name: Martin Taylor   Number of children: 3   Years of education: Not on file   Highest education level: Master's degree (e.g., MA, MS, MEng, MEd, MSW, MBA)  Occupational History   Occupation: Teacher/trainer/self-contracting  Tobacco Use   Smoking status: Light Smoker    Types: Cigars   Smokeless tobacco: Never   Tobacco comments:    occasional holiday smoker   Vaping Use   Vaping Use: Never used  Substance and Sexual Activity   Alcohol use: Yes    Alcohol/week: 6.0 standard drinks    Types: 2 Glasses of wine, 4 Cans of beer per week    Comment: two glasses of wine a week and 4 to 6 beers a week   Drug use: Never   Sexual activity: Yes    Comment: married  Other Topics Concern   Not on file  Social History Narrative   Not on file   Social Determinants of Health   Financial Resource Strain: Not on file  Food Insecurity: Not on file  Transportation Needs: Not on file  Physical Activity: Not  on file  Stress: Not on file  Social Connections: Not on file  Intimate Partner Violence: Not on file    Outpatient Medications Prior to Visit  Medication Sig Dispense Refill   benzonatate (TESSALON) 100 MG capsule Take 1 capsule (100 mg total) by mouth every 8 (eight) hours. 21 capsule 0   cholestyramine (QUESTRAN) 4 GM/DOSE powder Take 1 packet (4 g total) by mouth at bedtime. 90 packet 3   cholestyramine light 4 g POWD Take 4 G of powder by mouth at bedtime 201.6 g 0   diphenhydrAMINE (BENADRYL) 25 MG tablet Take 12.5 mg by mouth every 6 (six) hours as needed.     escitalopram (LEXAPRO) 20 MG tablet Take 20 mg by mouth daily.     fexofenadine (ALLEGRA) 60 MG tablet Take 60 mg by mouth 2 (two) times daily.     fluticasone (FLONASE) 50 MCG/ACT nasal spray Place 2 sprays  into both nostrils daily. 16 g 6   hyoscyamine (LEVSIN SL) 0.125 MG SL tablet Place 1 tablet (0.125 mg total) under the tongue every 6 (six) hours as needed. 15 tablet 3   Melatonin 3-2 MG TABS Take 2 mg by mouth daily.     Multiple Vitamin (MULTIVITAMIN ADULT PO) Take by mouth.     ondansetron (ZOFRAN ODT) 4 MG disintegrating tablet 4mg  ODT q4 hours prn nausea/vomit 20 tablet 0   doxycycline (VIBRAMYCIN) 100 MG capsule Take 1 capsule (100 mg total) by mouth 2 (two) times daily. One po bid x 7 days 14 capsule 0   No facility-administered medications prior to visit.    Allergies  Allergen Reactions   Codeine     Review of Systems  Constitutional:  Negative for fever and malaise/fatigue.  HENT:  Positive for sinus pain and sore throat. Negative for congestion.   Eyes:  Negative for redness.  Respiratory:  Positive for cough and sputum production. Negative for shortness of breath.   Cardiovascular:  Positive for chest pain. Negative for palpitations and leg swelling.  Gastrointestinal:  Negative for abdominal pain, blood in stool and nausea.  Genitourinary:  Negative for dysuria and frequency.  Musculoskeletal:  Negative for falls and myalgias.  Skin:  Negative for rash.  Neurological:  Positive for headaches. Negative for dizziness and loss of consciousness.  Endo/Heme/Allergies:  Negative for polydipsia.  Psychiatric/Behavioral:  Negative for depression. The patient is not nervous/anxious.       Objective:    Physical Exam Constitutional:      Appearance: Normal appearance. He is not ill-appearing.  HENT:     Head: Normocephalic and atraumatic.     Right Ear: Tympanic membrane, ear canal and external ear normal.     Left Ear: Ear canal and external ear normal.     Mouth/Throat:     Pharynx: Posterior oropharyngeal erythema present.  Eyes:     Conjunctiva/sclera: Conjunctivae normal.  Cardiovascular:     Rate and Rhythm: Normal rate and regular rhythm.     Heart sounds:  Normal heart sounds. No murmur heard. Pulmonary:     Breath sounds: Wheezing present. No rhonchi.  Abdominal:     General: Bowel sounds are normal. There is no distension.     Palpations: Abdomen is soft.     Tenderness: There is no abdominal tenderness.     Hernia: No hernia is present.  Musculoskeletal:     Cervical back: Neck supple.  Lymphadenopathy:     Cervical: No cervical adenopathy.  Skin:    General:  Skin is warm and dry.  Neurological:     Mental Status: He is alert and oriented to person, place, and time.  Psychiatric:        Behavior: Behavior normal.    BP 118/84   Pulse 72   Temp 97.7 F (36.5 C)   Resp 16   Wt 180 lb 12.8 oz (82 kg)   SpO2 96%   BMI 27.49 kg/m  Wt Readings from Last 3 Encounters:  11/26/21 180 lb 12.8 oz (82 kg)  07/08/21 172 lb (78 kg)  01/02/21 174 lb (78.9 kg)    Diabetic Foot Exam - Simple   No data filed    Lab Results  Component Value Date   WBC 6.3 01/02/2021   HGB 16.1 01/02/2021   HCT 46.5 01/02/2021   PLT 225.0 01/02/2021   GLUCOSE 90 01/02/2021   ALT 25 01/02/2021   AST 27 01/02/2021   NA 136 01/02/2021   K 4.1 01/02/2021   CL 103 01/02/2021   CREATININE 0.99 01/02/2021   BUN 15 01/02/2021   CO2 27 01/02/2021    No results found for: TSH Lab Results  Component Value Date   WBC 6.3 01/02/2021   HGB 16.1 01/02/2021   HCT 46.5 01/02/2021   MCV 89.6 01/02/2021   PLT 225.0 01/02/2021   Lab Results  Component Value Date   NA 136 01/02/2021   K 4.1 01/02/2021   CO2 27 01/02/2021   GLUCOSE 90 01/02/2021   BUN 15 01/02/2021   CREATININE 0.99 01/02/2021   BILITOT 1.0 01/02/2021   ALKPHOS 58 01/02/2021   AST 27 01/02/2021   ALT 25 01/02/2021   PROT 7.1 01/02/2021   ALBUMIN 4.5 01/02/2021   CALCIUM 9.5 01/02/2021   GFR 86.87 01/02/2021   No results found for: CHOL No results found for: HDL No results found for: LDLCALC No results found for: TRIG No results found for: CHOLHDL No results found for:  HGBA1C     Assessment & Plan:   Problem List Items Addressed This Visit     COVID-19    He traveled from [place] roughly 10 days ago.  Right after his travels he developed a sore throat and then further symptoms have ensued he did take 2 COVID test last week and they were both negative but at this point his symptoms continue to worsen.  He is endorsing cough productive of yellow phlegm chest pain with coughing.  He notes head congestion, myalgias, headaches and malaise.  He denies fevers and chills.  He has been taking zinc and vitamin C but unfortunately his tightness in his chest his cough and shortness of breath continue to worsen.  His flu test here in the office is negative but his COVID test is faintly positive.  He is outside of the window for treatment with antivirals.  He was improving and is now worsened suggesting possible secondary bacterial infection she was given a shot of Solu-Medrol here in the office and started on a Medrol dose pack tomorrow.  He is given cefdinir to take twice daily.  He is allowed a refill on his albuterol.  He is encouraged to take Mucinex twice daily, increase hydration and rest.  Use albuterol twice daily and as needed and seek care if symptoms worsen.  Encouraged to monitor his temperature and his oxygen levels and to seek care if his oxygen drops into the mid 80s and does not improve with deep breathing.      Relevant Medications  cefdinir (OMNICEF) 300 MG capsule   Asthma    Patient was diagnosed with asthma in childhood roughly age 18.  As a general rule it has been exercise-induced but since he had an episode of COVID back in August he is having more frequent flares and irritability.  He is given a refill on his albuterol and if his wheezing and cough continue to be escalated he will need referral for further pulmonary evaluation.      Relevant Medications   albuterol (VENTOLIN HFA) 108 (90 Base) MCG/ACT inhaler   montelukast (SINGULAIR) 10 MG tablet    methylPREDNISolone (MEDROL) 4 MG tablet   methylPREDNISolone sodium succinate (SOLU-MEDROL) 125 mg/2 mL injection   Other Visit Diagnoses     Cough, unspecified type    -  Primary   Relevant Orders   DG Chest 2 View        Meds ordered this encounter  Medications   albuterol (VENTOLIN HFA) 108 (90 Base) MCG/ACT inhaler    Sig: Inhale 1-2 puffs into the lungs every 6 (six) hours as needed for wheezing or shortness of breath.    Dispense:  18 g    Refill:  2   montelukast (SINGULAIR) 10 MG tablet    Sig: Take 1 tablet (10 mg total) by mouth at bedtime as needed (allergies).    Dispense:  30 tablet    Refill:  3   cefdinir (OMNICEF) 300 MG capsule    Sig: Take 1 capsule (300 mg total) by mouth 2 (two) times daily for 10 days.    Dispense:  20 capsule    Refill:  0   methylPREDNISolone (MEDROL) 4 MG tablet    Sig: 5 tabs po x 1 day then 4 tabs po x 1 day then 3 tabs po x 1 day then 2 tabs po x 1 day then 1 tab po x 1 day and stop    Dispense:  15 tablet    Refill:  0   methylPREDNISolone sodium succinate (SOLU-MEDROL) 125 mg/2 mL injection    Sig: Inject 2 mLs (125 mg total) into the muscle once for 1 dose.    Dispense:  2 mL    Refill:  0   promethazine-dextromethorphan (PROMETHAZINE-DM) 6.25-15 MG/5ML syrup    Sig: Take 2.5-5 mLs by mouth 2 (two) times daily as needed for cough.    Dispense:  118 mL    Refill:  0    I,Martin Taylor,acting as a scribe for Danise Edge, MD.,have documented all relevant documentation on the behalf of Danise Edge, MD,as directed by  Danise Edge, MD while in the presence of Danise Edge, MD.   I, Martin Canary, MD. , personally preformed the services described in this documentation.  All medical record entries made by the scribe were at my direction and in my presence.  I have reviewed the chart and discharge instructions (if applicable) and agree that the record reflects my personal performance and is accurate and complete. 11/26/2021

## 2021-11-26 NOTE — Patient Instructions (Addendum)
Multistrain probiotic  Plain mucinex twice daily Hydrate   Acute Bronchitis, Adult Acute bronchitis is sudden inflammation of the main airways (bronchi) that come off the windpipe (trachea) in the lungs. The swelling causes the airways to get smaller and make more mucus than normal. This can make it hard to breathe and can cause coughing or noisy breathing (wheezing). Acute bronchitis may last several weeks. The cough may last longer. Allergies, asthma, and exposure to smoke may make the condition worse. What are the causes? This condition can be caused by germs and by substances that irritate the lungs, including: Cold and flu viruses. The most common cause of this condition is the virus that causes the common cold. Bacteria. This is less common. Breathing in substances that irritate the lungs, including: Smoke from cigarettes and other forms of tobacco. Dust and pollen. Fumes from household cleaning products, gases, or burned fuel. Indoor or outdoor air pollution. What increases the risk? The following factors may make you more likely to develop this condition: A weak body's defense system, also called the immune system. A condition that affects your lungs and breathing, such as asthma. What are the signs or symptoms? Common symptoms of this condition include: Coughing. This may bring up clear, yellow, or green mucus from your lungs (sputum). Wheezing. Runny or stuffy nose. Having too much mucus in your lungs (chest congestion). Shortness of breath. Aches and pains, including sore throat or chest. How is this diagnosed? This condition is usually diagnosed based on: Your symptoms and medical history. A physical exam. You may also have other tests, including tests to rule out other conditions, such as pneumonia. These tests include: A test of lung function. Test of a mucus sample to look for the presence of bacteria. Tests to check the oxygen level in your blood. Blood  tests. Chest X-ray. How is this treated? Most cases of acute bronchitis clear up over time without treatment. Your health care provider may recommend: Drinking more fluids to help thin your mucus so it is easier to cough up. Taking inhaled medicine (inhaler) to improve air flow in and out of your lungs. Using a vaporizer or a humidifier. These are machines that add water to the air to help you breathe better. Taking a medicine that thins mucus and clears congestion (expectorant). Taking a medicine that prevents or stops coughing (cough suppressant). It is notcommon to take an antibiotic medicine for this condition. Follow these instructions at home:  Take over-the-counter and prescription medicines only as told by your health care provider. Use an inhaler, vaporizer, or humidifier as told by your health care provider. Take two teaspoons (10 mL) of honey at bedtime to lessen coughing at night. Drink enough fluid to keep your urine pale yellow. Do not use any products that contain nicotine or tobacco. These products include cigarettes, chewing tobacco, and vaping devices, such as e-cigarettes. If you need help quitting, ask your health care provider. Get plenty of rest. Return to your normal activities as told by your health care provider. Ask your health care provider what activities are safe for you. Keep all follow-up visits. This is important. How is this prevented? To lower your risk of getting this condition again: Wash your hands often with soap and water for at least 20 seconds. If soap and water are not available, use hand sanitizer. Avoid contact with people who have cold symptoms. Try not to touch your mouth, nose, or eyes with your hands. Avoid breathing in smoke or chemical  fumes. Breathing smoke or chemical fumes will make your condition worse. Get the flu shot every year. Contact a health care provider if: Your symptoms do not improve after 2 weeks. You have trouble coughing  up the mucus. Your cough keeps you awake at night. You have a fever. Get help right away if you: Cough up blood. Feel pain in your chest. Have severe shortness of breath. Faint or keep feeling like you are going to faint. Have a severe headache. Have a fever or chills that get worse. These symptoms may represent a serious problem that is an emergency. Do not wait to see if the symptoms will go away. Get medical help right away. Call your local emergency services (911 in the [place] Do not drive yourself to the hospital. Summary Acute bronchitis is inflammation of the main airways (bronchi) that come off the windpipe (trachea) in the lungs. The swelling causes the airways to get smaller and make more mucus than normal. Drinking more fluids can help thin your mucus so it is easier to cough up. Take over-the-counter and prescription medicines only as told by your health care provider. Do not use any products that contain nicotine or tobacco. These products include cigarettes, chewing tobacco, and vaping devices, such as e-cigarettes. If you need help quitting, ask your health care provider. Contact a health care provider if your symptoms do not improve after 2 weeks. This information is not intended to replace advice given to you by your health care provider. Make sure you discuss any questions you have with your health care provider. Document Revised: 04/11/2021 Document Reviewed: 04/11/2021 Elsevier Patient Education  2022 [employer].

## 2021-11-26 NOTE — Assessment & Plan Note (Signed)
Patient was diagnosed with asthma in childhood roughly age 55.  As a general rule it has been exercise-induced but since he had an episode of COVID back in August he is having more frequent flares and irritability.  He is given a refill on his albuterol and if his wheezing and cough continue to be escalated he will need referral for further pulmonary evaluation.

## 2021-11-26 NOTE — Assessment & Plan Note (Signed)
He traveled from [place] roughly 10 days ago.  Right after his travels he developed a sore throat and then further symptoms have ensued he did take 2 COVID test last week and they were both negative but at this point his symptoms continue to worsen.  He is endorsing cough productive of yellow phlegm chest pain with coughing.  He notes head congestion, myalgias, headaches and malaise.  He denies fevers and chills.  He has been taking zinc and vitamin C but unfortunately his tightness in his chest his cough and shortness of breath continue to worsen.  His flu test here in the office is negative but his COVID test is faintly positive.  He is outside of the window for treatment with antivirals.  He was improving and is now worsened suggesting possible secondary bacterial infection she was given a shot of Solu-Medrol here in the office and started on a Medrol dose pack tomorrow.  He is given cefdinir to take twice daily.  He is allowed a refill on his albuterol.  He is encouraged to take Mucinex twice daily, increase hydration and rest.  Use albuterol twice daily and as needed and seek care if symptoms worsen.  Encouraged to monitor his temperature and his oxygen levels and to seek care if his oxygen drops into the mid 80s and does not improve with deep breathing.

## 2021-12-04 DIAGNOSIS — R69 Illness, unspecified: Secondary | ICD-10-CM | POA: Diagnosis not present

## 2022-01-01 DIAGNOSIS — R69 Illness, unspecified: Secondary | ICD-10-CM | POA: Diagnosis not present

## 2022-01-09 IMAGING — DX DG CHEST 2V
2 series · 2 of 2 positions shown · non-contrast
Comparison: None.

CLINICAL DATA: Cough and wheezing.

EXAM:
CHEST - 2 VIEW

[chest pa]
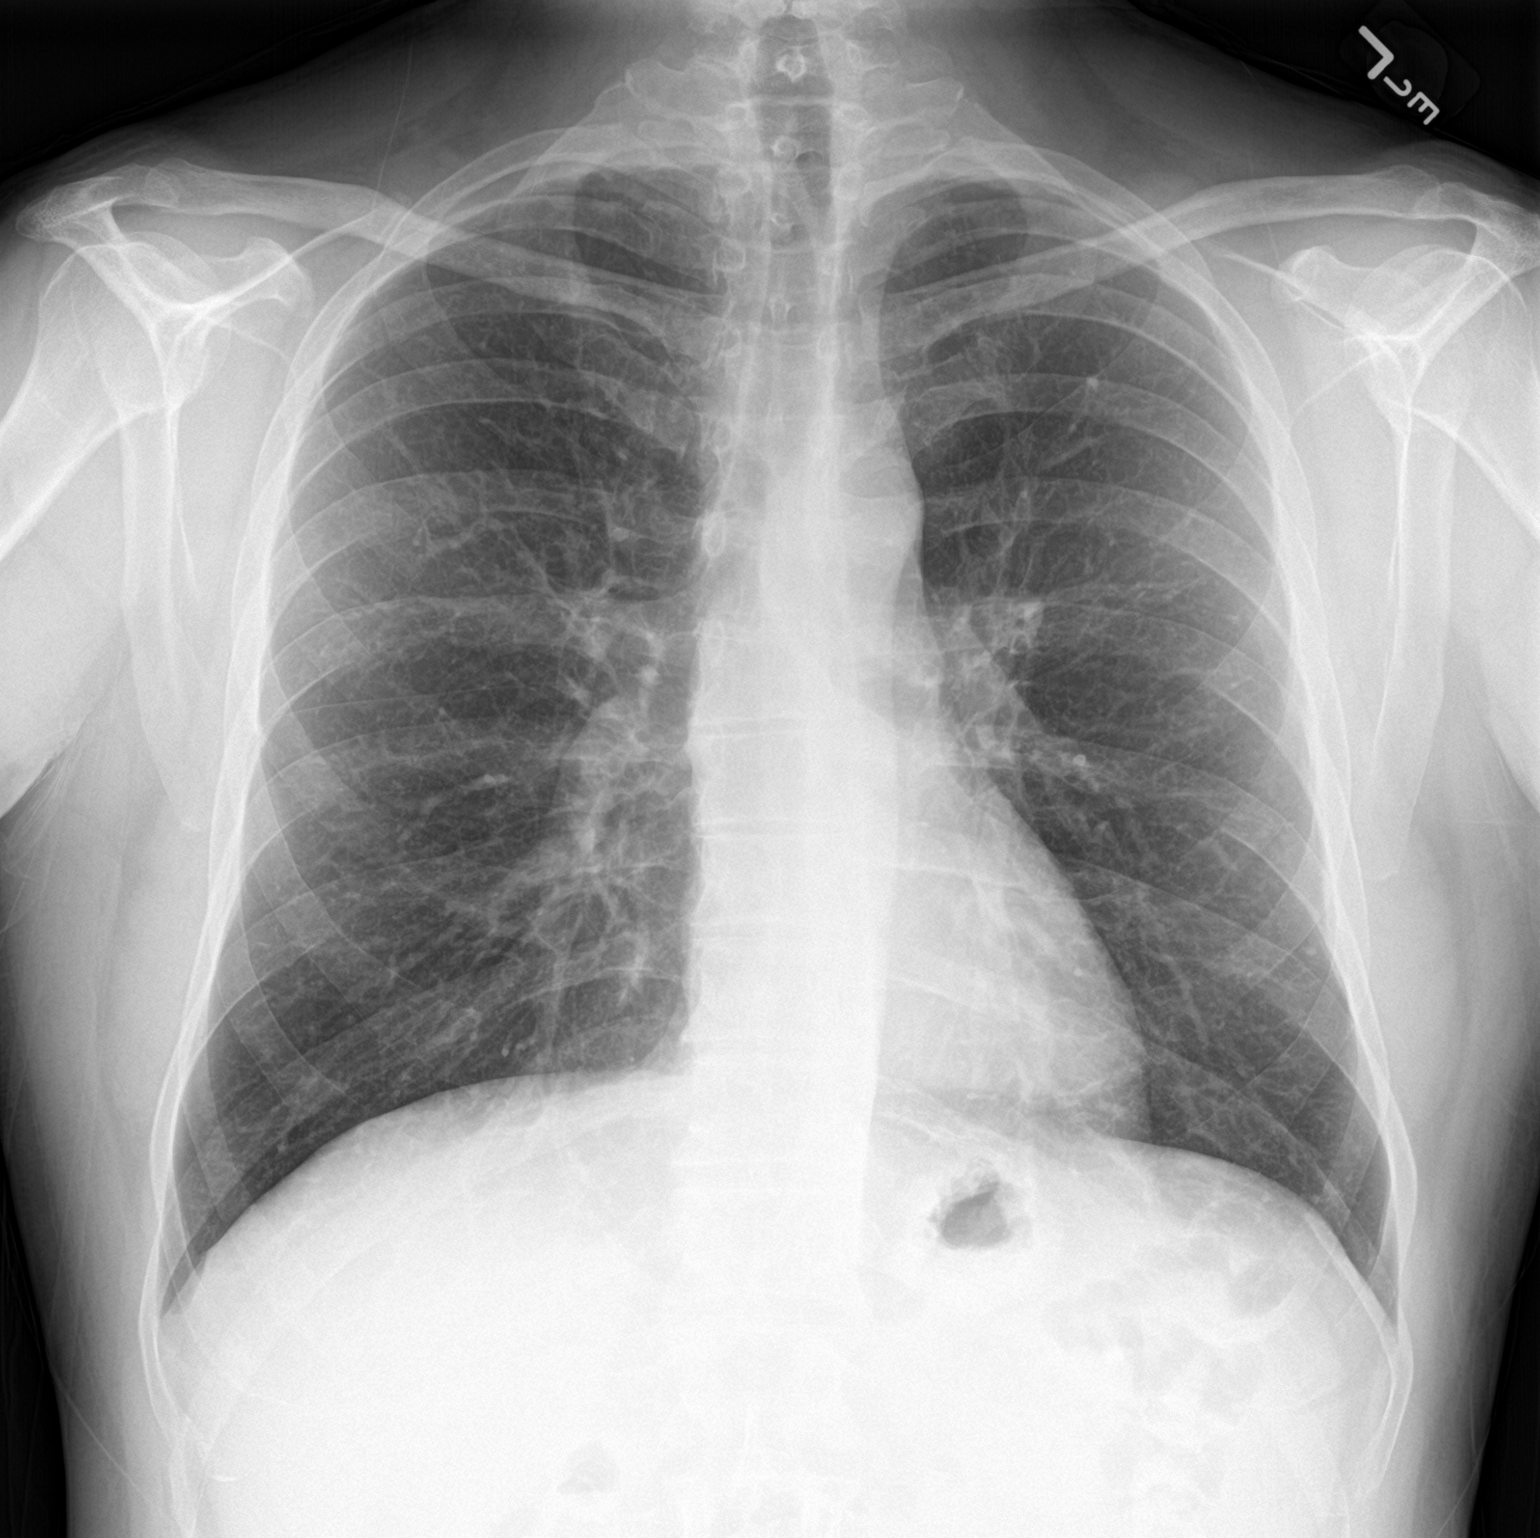

[chest lat]
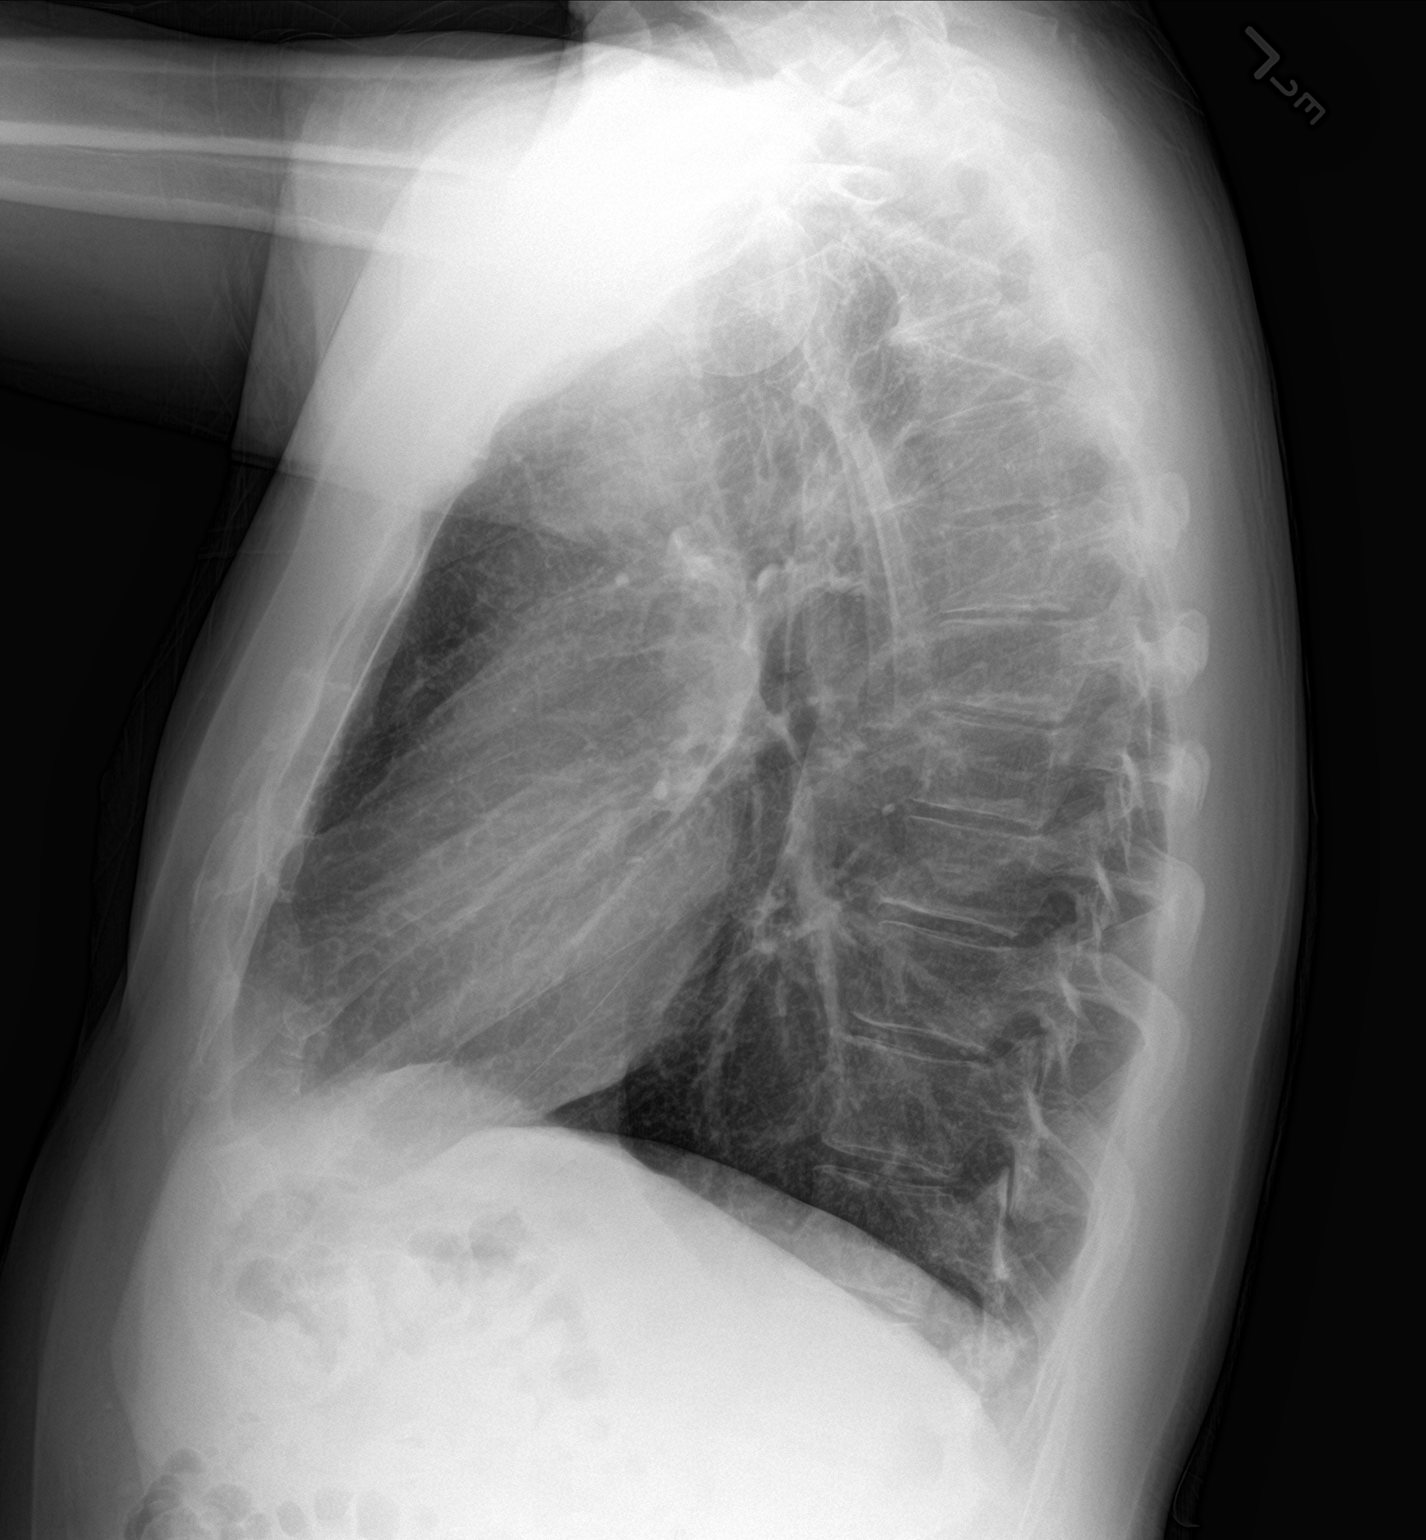

[2 of 2 positions shown; findings below may reference images not displayed]

FINDINGS: The cardiomediastinal silhouette is within normal limits. There is
no focal airspace consolidation. There is no large pleural effusion.
No visible pneumothorax. There is no acute osseous abnormality.
IMPRESSION: No evidence of acute cardiopulmonary disease.

## 2022-01-21 ENCOUNTER — Ambulatory Visit (INDEPENDENT_AMBULATORY_CARE_PROVIDER_SITE_OTHER): Payer: 59 | Admitting: Family Medicine

## 2022-01-21 ENCOUNTER — Ambulatory Visit (HOSPITAL_BASED_OUTPATIENT_CLINIC_OR_DEPARTMENT_OTHER)
Admission: RE | Admit: 2022-01-21 | Discharge: 2022-01-21 | Disposition: A | Discharge status: Home / Self Care | Payer: 59 | Source: Ambulatory Visit | Attending: Family Medicine | Admitting: Family Medicine

## 2022-01-21 ENCOUNTER — Other Ambulatory Visit: Payer: Self-pay

## 2022-01-21 ENCOUNTER — Encounter: Payer: Self-pay | Admitting: Family Medicine

## 2022-01-21 VITALS — BP 131/83 | HR 68 | Temp 97.8°F | Ht 68.0 in | Wt 189.1 lb

## 2022-01-21 DIAGNOSIS — R5383 Other fatigue: Secondary | ICD-10-CM

## 2022-01-21 DIAGNOSIS — Z7689 Persons encountering health services in other specified circumstances: Secondary | ICD-10-CM

## 2022-01-21 DIAGNOSIS — R0683 Snoring: Secondary | ICD-10-CM

## 2022-01-21 DIAGNOSIS — M79641 Pain in right hand: Secondary | ICD-10-CM

## 2022-01-21 DIAGNOSIS — M25562 Pain in left knee: Secondary | ICD-10-CM | POA: Diagnosis not present

## 2022-01-21 DIAGNOSIS — R635 Abnormal weight gain: Secondary | ICD-10-CM | POA: Diagnosis not present

## 2022-01-21 DIAGNOSIS — M7989 Other specified soft tissue disorders: Secondary | ICD-10-CM | POA: Diagnosis not present

## 2022-01-21 LAB — CBC WITH DIFFERENTIAL/PLATELET
Basophils Absolute: 0 10*3/uL (ref 0.0–0.1)
Basophils Relative: 0.6 % (ref 0.0–3.0)
Eosinophils Absolute: 0.3 10*3/uL (ref 0.0–0.7)
Eosinophils Relative: 4.7 % (ref 0.0–5.0)
HCT: 46.3 % (ref 39.0–52.0)
Hemoglobin: 15.2 g/dL (ref 13.0–17.0)
Lymphocytes Relative: 16.1 % (ref 12.0–46.0)
Lymphs Abs: 1.1 10*3/uL (ref 0.7–4.0)
MCHC: 32.8 g/dL (ref 30.0–36.0)
MCV: 91.2 fl (ref 78.0–100.0)
Monocytes Absolute: 0.8 10*3/uL (ref 0.1–1.0)
Monocytes Relative: 12.2 % — ABNORMAL HIGH (ref 3.0–12.0)
Neutro Abs: 4.5 10*3/uL (ref 1.4–7.7)
Neutrophils Relative %: 66.4 % (ref 43.0–77.0)
Platelets: 257 10*3/uL (ref 150.0–400.0)
RBC: 5.07 Mil/uL (ref 4.22–5.81)
RDW: 13.3 % (ref 11.5–15.5)
WBC: 6.7 10*3/uL (ref 4.0–10.5)

## 2022-01-21 LAB — COMPREHENSIVE METABOLIC PANEL
ALT: 22 U/L (ref 0–53)
AST: 22 U/L (ref 0–37)
Albumin: 4.3 g/dL (ref 3.5–5.2)
Alkaline Phosphatase: 78 U/L (ref 39–117)
BUN: 19 mg/dL (ref 6–23)
CO2: 27 mEq/L (ref 19–32)
Calcium: 9.5 mg/dL (ref 8.4–10.5)
Chloride: 104 mEq/L (ref 96–112)
Creatinine, Ser: 1.23 mg/dL (ref 0.40–1.50)
GFR: 66.45 mL/min (ref >60.00)
Glucose, Bld: 86 mg/dL (ref 70–99)
Potassium: 4.4 mEq/L (ref 3.5–5.1)
Sodium: 139 mEq/L (ref 135–145)
Total Bilirubin: 0.7 mg/dL (ref 0.2–1.2)
Total Protein: 6.9 g/dL (ref 6.0–8.3)

## 2022-01-21 LAB — TSH: TSH: 2.37 u[IU]/mL (ref 0.35–5.50)

## 2022-01-21 NOTE — Progress Notes (Signed)
______________________________________________________________________  HPI Martin Taylor is a 56 y.o. male presenting to [facility] today to establish care. Moved to [place] about 2 years with wife.   Patient Care Team: Clayborne Dana, NP as PCP - General (Family Medicine)    Health Maintenance  Topic Date Due   COVID-19 Vaccine (1) Never done   HIV Screening  Never done   Hepatitis C Screening: USPSTF Recommendation to screen - Ages 40-79 yo.  Never done   Zoster (Shingles) Vaccine (2 of 2) 11/20/2020   Tetanus Vaccine  12/23/2025   Colon Cancer Screening  06/04/2026   Flu Shot  Completed   HPV Vaccine  Aged Out     Concerns today: Patient reports that ever since he had COVID last July he has had some ongoing mild fatigue and a 15 pound weight gain that he is having trouble losing.  States he has not started exercising regularly but he is trying to watch his diet more closely as he feels like his metabolism is slowing down.  Also since having COVID he has noticed some bilateral pain to both hands, most prominent discomfort is in right first PIP status is it is tender to palpation and with range of motion.  Reports will occasionally swell slightly.  This joint pain has gradually worsened over the past several months.  Denies any deformity or history of rheumatoid arthritis. Patient states that for the past month his left knee has been swollen and bothering him.  States he had noticed the pain for a while but then when he fell ice skating and hit his knee he noticed significant pain and the pain has been reproduced with kneeling.  States it is sharp and burning pain that progressively worsens.  He denies any redness, warmth, drainage, open lacerations.  Reports full range of motion.  No internal pain. Patient states his wife has been complaining about him snoring more recently.  He is also waking up tired and not feeling well rested.  He is sleepy during  the day and would like to take naps if his schedule allows.  Wife has told him that he will occasionally stop breathing during his sleep.  He would like to proceed with referral for sleep study.      Patient Active Problem List   Diagnosis Date Noted   COVID-19 11/26/2021   Asthma 11/26/2021    PHQ9 Today: Depression screen [facility] 2/9 01/21/2022 11/26/2021 09/25/2020  Decreased Interest 0 0 0  Down, Depressed, Hopeless 0 0 0  PHQ - 2 Score 0 0 0   GAD7 Today: No flowsheet data found. ______________________________________________________________________ PMH Past Medical History:  Diagnosis Date   Allergy    Asthma    Blood transfusion without reported diagnosis 1984   Concussion 1997   Depression    Diverticulitis 2017    ROS All review of systems negative except what is listed in the HPI  PHYSICAL EXAM Physical Exam Vitals reviewed.  Constitutional:      Appearance: Normal appearance.  HENT:     Head: Normocephalic and atraumatic.  Eyes:     Extraocular Movements: Extraocular movements intact.     Conjunctiva/sclera: Conjunctivae normal.  Cardiovascular:     Rate and Rhythm: Normal rate and regular rhythm.     Pulses: Normal pulses.     Heart sounds: Normal heart sounds.  Pulmonary:     Effort: Pulmonary effort is normal.     Breath sounds: Normal breath sounds.  Musculoskeletal:        General: Normal range of motion.     Cervical back: Normal range of motion and neck supple.     Comments: Left knee with swelling consistent with prepatellar bursitis, no erythema/warmth; tenderness to palpation of right 1st PIP, no swelling, erythema, warmth.   Skin:    General: Skin is warm and dry.     Findings: No bruising or erythema.  Neurological:     General: No focal deficit present.     Mental Status: He is alert and oriented to person, place, and time. Mental status is at baseline.  Psychiatric:        Mood and Affect: Mood normal.        Behavior: Behavior normal.         Thought Content: Thought content normal.        Judgment: Judgment normal.   ______________________________________________________________________ ASSESSMENT AND PLAN  1. Fatigue, unspecified type 2. Snoring 3. Weight gain Labs today, referral to pulm for sleep study.  - Ambulatory referral to Pulmonology - CBC with Differential/Platelet - Comprehensive metabolic panel - TSH  4. Acute pain of left knee Consider prepatellar bursitis - sports med may need to drain. Referral placed. - Ambulatory referral to Sports Medicine  5. Pain of right hand Xray to check for arthritis. If evident and labs good, start NSAIDs. - Ambulatory referral to Sports Medicine - DG Hand Complete Right; Future  6. Encounter to establish care Education provided today during visit and on AVS for patient to review at home.  Diet and Exercise recommendations provided.  Current diagnoses and recommendations discussed. HM recommendations reviewed with recommendations.    Outpatient Encounter Medications as of 01/21/2022  Medication Sig   albuterol (VENTOLIN HFA) 108 (90 Base) MCG/ACT inhaler Inhale 1-2 puffs into the lungs every 6 (six) hours as needed for wheezing or shortness of breath.   cholestyramine (QUESTRAN) 4 GM/DOSE powder Take 1 packet (4 g total) by mouth at bedtime.   diphenhydrAMINE (BENADRYL) 25 MG tablet Take 12.5 mg by mouth every 6 (six) hours as needed.   escitalopram (LEXAPRO) 20 MG tablet Take 20 mg by mouth daily.   fexofenadine (ALLEGRA) 60 MG tablet Take 60 mg by mouth 2 (two) times daily.   fluticasone (FLONASE) 50 MCG/ACT nasal spray Place 2 sprays into both nostrils daily.   hyoscyamine (LEVSIN SL) 0.125 MG SL tablet Place 1 tablet (0.125 mg total) under the tongue every 6 (six) hours as needed.   Multiple Vitamin (MULTIVITAMIN ADULT PO) Take by mouth.   buPROPion ER (WELLBUTRIN SR) 100 MG 12 hr tablet Take 100 mg by mouth every morning.   Melatonin 3-2 MG TABS Take 2 mg by  mouth daily. (Patient not taking: Reported on 01/21/2022)   [DISCONTINUED] benzonatate (TESSALON) 100 MG capsule Take 1 capsule (100 mg total) by mouth every 8 (eight) hours.   [DISCONTINUED] cholestyramine light 4 g POWD Take 4 G of powder by mouth at bedtime   [DISCONTINUED] methylPREDNISolone (MEDROL) 4 MG tablet 5 tabs po x 1 day then 4 tabs po x 1 day then 3 tabs po x 1 day then 2 tabs po x 1 day then 1 tab po x 1 day and stop   [DISCONTINUED] montelukast (SINGULAIR) 10 MG tablet Take 1 tablet (10 mg total) by mouth at bedtime as needed (allergies).   [DISCONTINUED] ondansetron (ZOFRAN ODT) 4 MG disintegrating tablet 4mg  ODT q4 hours prn nausea/vomit   [DISCONTINUED] promethazine-dextromethorphan (PROMETHAZINE-DM) 6.25-15 MG/5ML syrup  Take 2.5-5 mLs by mouth 2 (two) times daily as needed for cough.   No facility-administered encounter medications on file as of 01/21/2022.    Return in about 6 months (around 07/21/2022) for physical. Please contact office for sooner follow-up if symptoms do not improve or worsen. Seek emergency care if symptoms become severe.    Lollie Marrow Reola Calkins, DNP, FNP-C

## 2022-01-21 NOTE — Patient Instructions (Signed)
Thank you for choosing [facility] for your Primary Care needs. I am excited for the opportunity to partner with you to meet your health care goals. It was a pleasure meeting you today! ° ° °Information on diet, exercise, and health maintenance recommendations are listed below. This is information to help you be sure you are on track for optimal health and monitoring.  ° °Please look over this and let us know if you have any questions or if you have completed any of the health maintenance outside of [facility] so that we can be sure your records are up to date.  °___________________________________________________________ ° °MyChart:  °For all urgent or time sensitive needs we ask that you please call the office to avoid delays. Our number is [phone]. °MyChart is not constantly monitored and due to the large volume of messages a day, replies may take up to 72 business hours. ° °MyChart Policy: °MyChart allows for you to see your visit notes, after visit summary, provider recommendations, lab and tests results, make an appointment, request refills, and contact your provider or the office for non-urgent questions or concerns. Providers are seeing patients during normal business hours and do not have built in time to review MyChart messages.  °We ask that you allow a minimum of 3 business days for responses to MyChart messages. For this reason, please do not send urgent requests through MyChart. Please call the office at [phone]. °New and ongoing conditions may require a visit. We have virtual and in-person visits available for your convenience.  °Complex MyChart concerns may require a visit. Your provider may request you schedule a virtual or in-person visit to ensure we are providing the best care possible. °MyChart messages sent after 11:00 AM on Friday will not be received by the provider until Monday morning.  °  °Lab and Test Results: °You will receive your lab and  test results on MyChart as soon as they are completed and results have been sent by the lab or testing facility. Due to this service, you will receive your results BEFORE your provider.  °I review lab and test results each morning prior to seeing patients. Some results require collaboration with other providers to ensure you are receiving the most appropriate care. For this reason, we ask that you please allow a minimum of 3-5 business days from the time that ALL results have been received for your provider to receive and review lab and test results and contact you about these.  °Most lab and test result comments from the provider will be sent through MyChart. Your provider may recommend changes to the plan of care, follow-up visits, repeat testing, ask questions, or request an office visit to discuss these results. You may reply directly to this message or call the office to provide information for the provider or set up an appointment. °In some instances, you will be called with test results and recommendations. Please let us know if this is preferred and we will make note of this in your chart to provide this for you.    °If you have not heard a response to your lab or test results in 5 business days from all results returning to MyChart, please call the office to let us know. We ask that you please avoid calling prior to this time unless there is an emergent concern. Due to high call volumes, this can delay the resulting process. ° °After Hours: °For all non-emergency after hours needs,   please call the office at [phone] and select the option to reach the on-call  service. On-call services are shared between multiple [facility] offices and therefore it will not be possible to speak directly with your provider. On-call providers may provide medical advice and recommendations, but are unable to provide refills for maintenance medications.  °For all emergency or urgent medical needs after normal business  hours, we recommend that you seek care at the closest Urgent Care or Emergency Department to ensure appropriate treatment in a timely manner.  °[facility] has a 24 hour emergency room located on the ground floor for your convenience.  ° °Urgent Concerns During the Business Day °Providers are seeing patients from 8AM to 5PM with a busy schedule and are most often not able to respond to non-urgent calls until the end of the day or the next business day. °If you should have URGENT concerns during the day, please call and speak to the nurse or schedule a same day appointment so that we can address your concern without delay.  ° °Thank you, again, for choosing me as your health care partner. I appreciate your trust and look forward to learning more about you.  ° °Amandy Chubbuck B. Brant Peets, DNP, FNP-C ° °___________________________________________________________ ° °Health Maintenance Recommendations °Screening Testing °Mammogram °Every 1-2 years based on history and risk factors °Starting at age 50 °Pap Smear °Ages 21-39 every 3 years °Ages 30-65 every 5 years with HPV testing °More frequent testing may be required based on results and history °Colon Cancer Screening °Every 1-10 years based on test performed, risk factors, and history °Starting at age 45 °Bone Density Screening °Every 2-10 years based on history °Starting at age 65 for women °Recommendations for men differ based on medication usage, history, and risk factors °AAA Screening °One time ultrasound °Men 65-75 years old who have ever smoked °Lung Cancer Screening °Low Dose Lung CT every 12 months °Age 50-80 years with a 20 pack-year smoking history who still smoke or who have quit within the last 15 years ° °Screening Labs °Routine  Labs: Complete Blood Count (CBC), Complete Metabolic Panel (CMP), Cholesterol (Lipid Panel) °Every 6-12 months based on history and medications °May be recommended more frequently based on current conditions or  previous results °Hemoglobin A1c Lab °Every 3-12 months based on history and previous results °Starting at age 45 or earlier with diagnosis of diabetes, high cholesterol, BMI >26, and/or risk factors °Frequent monitoring for patients with diabetes to ensure blood sugar control °Thyroid Panel (TSH w/ T3 & T4) °Every 6 months based on history, symptoms, and risk factors °May be repeated more often if on medication °HIV °One time testing for all patients 13 and older °May be repeated more frequently for patients with increased risk factors or exposure °Hepatitis C °One time testing for all patients 18 and older °May be repeated more frequently for patients with increased risk factors or exposure °Gonorrhea, Chlamydia °Every 12 months for all sexually active persons 13-24 years °Additional monitoring may be recommended for those who are considered high risk or who have symptoms °PSA °Men 40-54 years old with risk factors °Additional screening may be recommended from age 55-69 based on risk factors, symptoms, and history ° °Vaccine Recommendations °Tetanus Booster °All adults every 10 years °Flu Vaccine °All patients 6 months and older every year °COVID Vaccine °All patients 12 years and older °Initial dosing with booster °May recommend additional booster based on age and health history °HPV Vaccine °2 doses all patients   age 9-26 °Dosing may be considered for patients over 26 °Shingles Vaccine (Shingrix) °2 doses all adults 50 years and older °Pneumonia (Pneumovax 23) °All adults 65 years and older °May recommend earlier dosing based on health history °Pneumonia (Prevnar 13) °All adults 65 years and older °Dosed 1 year after Pneumovax 23 °Pneumonia (Prevnar 20) °All adults 65 years and older (adults 19-64 with certain conditions or risk factors) °1 dose  °For those who have no received Prevnar 13 vaccine previously ° ° °Additional Screening, Testing, and Vaccinations may be recommended on an individualized basis based on  family history, health history, risk factors, and/or exposure.  °__________________________________________________________ ° °Diet Recommendations for All Patients ° °I recommend that all patients maintain a diet low in saturated fats, carbohydrates, and cholesterol. While this can be challenging at first, it is not impossible and small changes can make big differences.  °Things to try: °Decreasing the amount of soda, sweet tea, and/or juice to one or less per day and replace with water °While water is always the first choice, if you do not like water you may consider °adding a water additive without sugar to improve the taste °other sugar free drinks °Replace potatoes with a brightly colored vegetable at dinner °Use healthy oils, such as canola oil or olive oil, instead of butter or hard margarine °Limit your bread intake to two pieces or less a day °Replace regular pasta with low carb pasta options °Bake, broil, or grill foods instead of frying °Monitor portion sizes  °Eat smaller, more frequent meals throughout the day instead of large meals ° °An important thing to remember is, if you love foods that are not great for your health, you don't have to give them up completely. Instead, allow these foods to be a reward when you have done well. Allowing yourself to still have special treats every once in a while is a nice way to tell yourself thank you for working hard to keep yourself healthy.  ° °Also remember that every day is a new day. If you have a bad day and "fall off the wagon", you can still climb right back up and keep moving along on your journey! ° °We have resources available to help you!  °Some websites that may be helpful include: °www.MyPlate.gov  °Www.VeryWellFit.com °_____________________________________________________________ ° °Activity Recommendations for All Patients ° °I recommend that all adults get at least 20 minutes of moderate physical activity that elevates your heart rate at least 5  days out of the week.  °Some examples include: °Walking or jogging at a pace that allows you to carry on a conversation °Cycling (stationary bike or outdoors) °Water aerobics °Yoga °Weight lifting °Dancing °If physical limitations prevent you from putting stress on your joints, exercise in a pool or seated in a chair are excellent options. ° °Do determine your MAXIMUM heart rate for activity: YOUR AGE - 220 = MAX HeartRate  ° °Remember! °Do not push yourself too hard.  °Start slowly and build up your pace, speed, weight, time in exercise, etc.  °Allow your body to rest between exercise and get good sleep. °You will need more water than normal when you are exerting yourself. Do not wait until you are thirsty to drink. Drink with a purpose of getting in at least 8, 8 ounce glasses of water a day plus more depending on how much you exercise and sweat.  ° ° °If you begin to develop dizziness, chest pain, abdominal pain, jaw pain, shortness of breath, headache, vision   changes, lightheadedness, or other concerning symptoms, stop the activity and allow your body to rest. If your symptoms are severe, seek emergency evaluation immediately. If your symptoms are concerning, but not severe, please let us know so that we can recommend further evaluation.  ° ° ° °

## 2022-01-22 DIAGNOSIS — R69 Illness, unspecified: Secondary | ICD-10-CM | POA: Diagnosis not present

## 2022-01-25 DIAGNOSIS — R69 Illness, unspecified: Secondary | ICD-10-CM | POA: Diagnosis not present

## 2022-01-28 ENCOUNTER — Ambulatory Visit: Payer: Self-pay

## 2022-01-28 ENCOUNTER — Ambulatory Visit (INDEPENDENT_AMBULATORY_CARE_PROVIDER_SITE_OTHER): Payer: 59 | Admitting: Family Medicine

## 2022-01-28 VITALS — BP 128/86 | Ht 68.0 in | Wt 181.0 lb

## 2022-01-28 DIAGNOSIS — M7042 Prepatellar bursitis, left knee: Secondary | ICD-10-CM | POA: Diagnosis not present

## 2022-01-28 DIAGNOSIS — M25562 Pain in left knee: Secondary | ICD-10-CM

## 2022-01-28 NOTE — Progress Notes (Signed)
°  Martin Taylor - 56 y.o. male MRN [id number]  Date of birth: 1966-04-16  SUBJECTIVE:  Including CC & ROS.  No chief complaint on file.   Martin Taylor is a 56 y.o. male that is presenting with acute left knee pain.  He is from.  Has been ongoing for a few weeks.  Denies any specific injury.  No redness.  No history of gout.  No history of surgery.  Review of the note from 1/30 shows he has provided anti-inflammatories. Independent review of the right hand x-ray from 1/30 shows no significant degenerative changes.  Review of Systems See HPI   HISTORY: Past Medical, Surgical, Social, and Family History Reviewed & Updated per EMR.   Pertinent Historical Findings include:  Past Medical History:  Diagnosis Date   Allergy    Asthma    Blood transfusion without reported diagnosis 1984   Concussion 1997   Depression    Diverticulitis 2017    Past Surgical History:  Procedure Laterality Date   APPENDECTOMY  1984   CHOLECYSTECTOMY  2006     PHYSICAL EXAM:  VS: BP 128/86    Ht 5\' 8"  (1.727 m)    Wt 181 lb (82.1 kg)    BMI 27.52 kg/m  Physical Exam Gen: NAD, alert, cooperative with exam, well-appearing MSK: Neurovascularly intact    Limited ultrasound: Left knee:  No effusion suprapatellar pouch. Normal-appearing quadricep and patellar tendon. Mild prepatellar bursa. Normal appearing medial and lateral joint space  Summary: Prepatellar bursitis of left knee  Ultrasound and interpretation by , MD    ASSESSMENT & PLAN:   Prepatellar bursitis, left knee Acutely occurring.  No signs of infection or inflammatory origin. -Counseled on home exercise therapy and supportive care. - counseled on compression. - could consider aspiration.

## 2022-01-28 NOTE — Patient Instructions (Signed)
Nice to meet you Please try compression  You can try ibuprofen 400 mg for 5 days straight. Please do not take with naproxen   Please send me a message in MyChart with any questions or updates.  Please see me back in 4 weeks.   --Dr. Jordan Likes

## 2022-01-28 NOTE — Assessment & Plan Note (Signed)
Acutely occurring.  No signs of infection or inflammatory origin. -Counseled on home exercise therapy and supportive care. - counseled on compression. - could consider aspiration.

## 2022-01-30 ENCOUNTER — Encounter: Payer: Self-pay | Admitting: Primary Care

## 2022-01-30 ENCOUNTER — Ambulatory Visit (INDEPENDENT_AMBULATORY_CARE_PROVIDER_SITE_OTHER): Payer: 59 | Admitting: Primary Care

## 2022-01-30 ENCOUNTER — Other Ambulatory Visit: Payer: Self-pay

## 2022-01-30 VITALS — BP 128/80 | HR 90 | Temp 98.1°F | Ht 68.0 in | Wt 187.6 lb

## 2022-01-30 DIAGNOSIS — R0683 Snoring: Secondary | ICD-10-CM | POA: Diagnosis not present

## 2022-01-30 DIAGNOSIS — J45909 Unspecified asthma, uncomplicated: Secondary | ICD-10-CM | POA: Diagnosis not present

## 2022-01-30 NOTE — Assessment & Plan Note (Addendum)
-   Hx childhood asthma, mostly exercise induced. Patient has noticed increased exertional dyspnea symtpoms since having covid for the second time in December 2022. Using SABA on average 2-3 times a month. Advised patient if respiratory symptoms worsen or do not improve to notify office and we would obtain pulmonary function testing.

## 2022-01-30 NOTE — Patient Instructions (Addendum)
Untreated sleep apnea puts you at higher risk for cardiac arrhytmias, pulmonary HTN, stroke and diabetes  Treatment options include weight loss, side sleeping position, oal appliance, CPAP or referral to ENT for surgical intervention  Encourage weight loss and side sleeping position  Orders: Home sleep study  Follow-up: 4-6 week virtual visit to review sleep study results and discuss treatments if needed  Sleep Apnea Sleep apnea is a condition in which breathing pauses or becomes shallow during sleep. People with sleep apnea usually snore loudly. They may have times when they gasp and stop breathing for 10 seconds or more during sleep. This may happen many times during the night. Sleep apnea disrupts your sleep and keeps your body from getting the rest that it needs. This condition can increase your risk of certain health problems, including: Heart attack. Stroke. Obesity. Type 2 diabetes. Heart failure. Irregular heartbeat. High blood pressure. The goal of treatment is to help you breathe normally again. What are the causes? The most common cause of sleep apnea is a collapsed or blocked airway. There are three kinds of sleep apnea: Obstructive sleep apnea. This kind is caused by a blocked or collapsed airway. Central sleep apnea. This kind happens when the part of the brain that controls breathing does not send the correct signals to the muscles that control breathing. Mixed sleep apnea. This is a combination of obstructive and central sleep apnea. What increases the risk? You are more likely to develop this condition if you: Are overweight. Smoke. Have a smaller than normal airway. Are older. Are male. Drink alcohol. Take sedatives or tranquilizers. Have a family history of sleep apnea. Have a tongue or tonsils that are larger than normal. What are the signs or symptoms? Symptoms of this condition include: Trouble staying asleep. Loud snoring. Morning headaches. Waking  up gasping. Dry mouth or sore throat in the morning. Daytime sleepiness and tiredness. If you have daytime fatigue because of sleep apnea, you may be more likely to have: Trouble concentrating. Forgetfulness. Irritability or mood swings. Personality changes. Feelings of depression. Sexual dysfunction. This may include loss of interest if you are male, or erectile dysfunction if you are male. How is this diagnosed? This condition may be diagnosed with: A medical history. A physical exam. A series of tests that are done while you are sleeping (sleep study). These tests are usually done in a sleep lab, but they may also be done at home. How is this treated? Treatment for this condition aims to restore normal breathing and to ease symptoms during sleep. It may involve managing health issues that can affect breathing, such as high blood pressure or obesity. Treatment may include: Sleeping on your side. Using a decongestant if you have nasal congestion. Avoiding the use of depressants, including alcohol, sedatives, and narcotics. Losing weight if you are overweight. Making changes to your diet. Quitting smoking. Using a device to open your airway while you sleep, such as: An oral appliance. This is a custom-made mouthpiece that shifts your lower jaw forward. A continuous positive airway pressure (CPAP) device. This device blows air through a mask when you breathe out (exhale). A nasal expiratory positive airway pressure (EPAP) device. This device has valves that you put into each nostril. A bi-level positive airway pressure (BIPAP) device. This device blows air through a mask when you breathe in (inhale) and breathe out (exhale). Having surgery if other treatments do not work. During surgery, excess tissue is removed to create a wider airway. Follow these  instructions at home: Lifestyle Make any lifestyle changes that your health care provider recommends. Eat a healthy, well-balanced  diet. Take steps to lose weight if you are overweight. Avoid using depressants, including alcohol, sedatives, and narcotics. Do not use any products that contain nicotine or tobacco. These products include cigarettes, chewing tobacco, and vaping devices, such as e-cigarettes. If you need help quitting, ask your health care provider. General instructions Take over-the-counter and prescription medicines only as told by your health care provider. If you were given a device to open your airway while you sleep, use it only as told by your health care provider. If you are having surgery, make sure to tell your health care provider you have sleep apnea. You may need to bring your device with you. Keep all follow-up visits. This is important. Contact a health care provider if: The device that you received to open your airway during sleep is uncomfortable or does not seem to be working. Your symptoms do not improve. Your symptoms get worse. Get help right away if: You develop: Chest pain. Shortness of breath. Discomfort in your back, arms, or stomach. You have: Trouble speaking. Weakness on one side of your body. Drooping in your face. These symptoms may represent a serious problem that is an emergency. Do not wait to see if the symptoms will go away. Get medical help right away. Call your local emergency services (911 in the [place] Do not drive yourself to the hospital. Summary Sleep apnea is a condition in which breathing pauses or becomes shallow during sleep. The most common cause is a collapsed or blocked airway. The goal of treatment is to restore normal breathing and to ease symptoms during sleep. This information is not intended to replace advice given to you by your health care provider. Make sure you discuss any questions you have with your health care provider. Document Revised: 07/18/2021 Document Reviewed: 11/17/2020 Elsevier Patient Education  2022 [employer].

## 2022-01-30 NOTE — Progress Notes (Signed)
@Patient  ID: Martin Taylor, male    DOB: 1966-07-03, 56 y.o.   MRN: [id number]  Chief Complaint  Patient presents with   Consult    Referring provider: Clayborne Dana, NP  HPI: 56 year old, light smoker. PMH significant for exercise induced asthma, covid-19 x2. Maintained on prn albuterol.   01/30/2022 Patient presents today for sleep consult. Referred by PCP for fatigue. He has symptoms of snoring and witnessed apnea. Patient had sleep study > 10 year ago which was borderline for OSA, he was never on CPAP. Typical bedtime is between 10-11:30 pm. It takes him on average 15-20 min to fall asleep. He wakes up 1-2 times at night. He starts his day around 7-8:30am. His weight is up 15 lbs. He has a family hx sleep apnea, his mother is on CPAP. No symptoms of cataplexy or narcolepsy. Epworth 9/24.   Sleep questionnaire  Symptoms: Snoring and witnessed apnea  Prior sleep study: > 10 year ago, never on CPAP  Bedtime: 10-11:30pm  Time to fall asleep: 15-65mins on average  Nocturnal awakenings: 1-2 times  Start of day: 7-8:30am  Weight changes: +15lbs  Epworth: 9/24   Allergies  Allergen Reactions   Codeine    Molds & Smuts Other (See Comments)    Stuffy nose, wheeze, runny nose, sneezing    Immunization History  Administered Date(s) Administered   Influenza,inj,Quad PF,6+ Mos 09/23/2019, 10/01/2020, 09/30/2021   Tdap 12/24/2015   Zoster Recombinat (Shingrix) 09/25/2020    Past Medical History:  Diagnosis Date   Allergy    Asthma    Blood transfusion without reported diagnosis 1984   Concussion 1997   Depression    Diverticulitis 2017    Tobacco History: Social History   Tobacco Use  Smoking Status Light Smoker   Types: Cigars  Smokeless Tobacco Never  Tobacco Comments   occasional holiday smoker    Ready to quit: Not Answered Counseling given: Not Answered Tobacco comments: occasional holiday smoker    Outpatient Medications Prior to Visit  Medication Sig  Dispense Refill   albuterol (VENTOLIN HFA) 108 (90 Base) MCG/ACT inhaler Inhale 1-2 puffs into the lungs every 6 (six) hours as needed for wheezing or shortness of breath. 18 g 2   buPROPion ER (WELLBUTRIN SR) 100 MG 12 hr tablet Take 100 mg by mouth every morning.     cholestyramine (QUESTRAN) 4 GM/DOSE powder Take 1 packet (4 g total) by mouth at bedtime. 90 packet 3   diphenhydrAMINE (BENADRYL) 25 MG tablet Take 12.5 mg by mouth every 6 (six) hours as needed.     escitalopram (LEXAPRO) 20 MG tablet Take 20 mg by mouth daily.     fexofenadine (ALLEGRA) 60 MG tablet Take 60 mg by mouth 2 (two) times daily.     fluticasone (FLONASE) 50 MCG/ACT nasal spray Place 2 sprays into both nostrils daily. 16 g 6   Multiple Vitamin (MULTIVITAMIN ADULT PO) Take by mouth.     hyoscyamine (LEVSIN SL) 0.125 MG SL tablet Place 1 tablet (0.125 mg total) under the tongue every 6 (six) hours as needed. (Patient not taking: Reported on 01/30/2022) 15 tablet 3   Melatonin 3-2 MG TABS Take 2 mg by mouth daily. (Patient not taking: Reported on 01/21/2022)     No facility-administered medications prior to visit.    Review of Systems  Review of Systems  Constitutional:  Positive for fatigue.  HENT: Negative.    Respiratory: Negative.    Cardiovascular: Negative.   Psychiatric/Behavioral:  Positive for sleep disturbance.     Physical Exam  BP 128/80 (BP Location: Left Arm, Patient Position: Sitting, Cuff Size: Normal)    Pulse 90    Temp 98.1 F (36.7 C) (Oral)    Ht 5\' 8"  (1.727 m)    Wt 187 lb 9.6 oz (85.1 kg)    SpO2 96%    BMI 28.52 kg/m  Physical Exam Constitutional:      Appearance: Normal appearance.  HENT:     Head: Normocephalic and atraumatic.     Mouth/Throat:     Mouth: Mucous membranes are moist.     Pharynx: Oropharynx is clear.  Cardiovascular:     Rate and Rhythm: Normal rate and regular rhythm.  Pulmonary:     Effort: Pulmonary effort is normal.     Breath sounds: Normal breath sounds.      Comments: CTA Musculoskeletal:        General: Normal range of motion.  Skin:    General: Skin is warm and dry.  Neurological:     General: No focal deficit present.     Mental Status: He is alert and oriented to person, place, and time. Mental status is at baseline.  Psychiatric:        Mood and Affect: Mood normal.        Behavior: Behavior normal.        Thought Content: Thought content normal.        Judgment: Judgment normal.     Lab Results:  CBC    Component Value Date/Time   WBC 6.7 01/21/2022 1130   RBC 5.07 01/21/2022 1130   HGB 15.2 01/21/2022 1130   HCT 46.3 01/21/2022 1130   PLT 257.0 01/21/2022 1130   MCV 91.2 01/21/2022 1130   MCHC 32.8 01/21/2022 1130   RDW 13.3 01/21/2022 1130   LYMPHSABS 1.1 01/21/2022 1130   MONOABS 0.8 01/21/2022 1130   EOSABS 0.3 01/21/2022 1130   BASOSABS 0.0 01/21/2022 1130    BMET    Component Value Date/Time   NA 139 01/21/2022 1130   K 4.4 01/21/2022 1130   CL 104 01/21/2022 1130   CO2 27 01/21/2022 1130   GLUCOSE 86 01/21/2022 1130   BUN 19 01/21/2022 1130   CREATININE 1.23 01/21/2022 1130   CALCIUM 9.5 01/21/2022 1130    BNP No results found for: BNP  ProBNP No results found for: PROBNP  Imaging: DG Hand Complete Right  Result Date: 01/22/2022 CLINICAL DATA:  Right hand pain and swelling for 1 month, initial encounter EXAM: RIGHT HAND - COMPLETE 3+ VIEW COMPARISON:  None. FINDINGS: No acute fracture or dislocation is noted. Mild soft tissue swelling is noted. No radiopaque foreign body is seen. IMPRESSION: Soft tissue swelling without bony abnormality. Electronically Signed   By: 01/24/2022 M.D.   On: 01/22/2022 02:41     Assessment & Plan:   Loud snoring - Patient has symptoms of loud snoring and witnessed apnea. BMI 28. Epworth 9/24. Concern patient could have obstructive sleep apnea, needs home sleep study to evaluate. Discussed risk of untreated sleep apnea including cardiac arrhthymias, pulm HTN,  stroke, DM. We briefly reviewed treatment options. Encourage weight loss, side sleeping position and advised against driving if experiencing excessive daytime sleepiness. Follow-up in 6 weeks to review sleep study results and treatment options if needed.   Asthma - Hx childhood asthma, mostly exercise induced. Patient has noticed increased exertional dyspnea symtpoms since having covid for the second time in December 2022.  Using SABA on average 2-3 times a month. Advised patient if respiratory symptoms worsen or do not improve to notify office and we would obtain pulmonary function testing.    Glenford Bayley, NP 01/30/2022

## 2022-01-30 NOTE — Assessment & Plan Note (Signed)
-   Patient has symptoms of loud snoring and witnessed apnea. BMI 28. Epworth 9/24. Concern patient could have obstructive sleep apnea, needs home sleep study to evaluate. Discussed risk of untreated sleep apnea including cardiac arrhthymias, pulm HTN, stroke, DM. We briefly reviewed treatment options. Encourage weight loss, side sleeping position and advised against driving if experiencing excessive daytime sleepiness. Follow-up in 6 weeks to review sleep study results and treatment options if needed.

## 2022-02-08 DIAGNOSIS — R69 Illness, unspecified: Secondary | ICD-10-CM | POA: Diagnosis not present

## 2022-02-11 DIAGNOSIS — R69 Illness, unspecified: Secondary | ICD-10-CM | POA: Diagnosis not present

## 2022-02-14 ENCOUNTER — Encounter: Payer: Self-pay | Admitting: Family Medicine

## 2022-02-14 DIAGNOSIS — M25562 Pain in left knee: Secondary | ICD-10-CM

## 2022-02-14 DIAGNOSIS — Z2089 Contact with and (suspected) exposure to other communicable diseases: Secondary | ICD-10-CM

## 2022-02-14 DIAGNOSIS — Z9189 Other specified personal risk factors, not elsewhere classified: Secondary | ICD-10-CM

## 2022-02-15 ENCOUNTER — Ambulatory Visit: Payer: 59

## 2022-02-15 ENCOUNTER — Other Ambulatory Visit: Payer: Self-pay

## 2022-02-15 DIAGNOSIS — R0683 Snoring: Secondary | ICD-10-CM

## 2022-02-18 ENCOUNTER — Other Ambulatory Visit: Payer: 59

## 2022-02-18 DIAGNOSIS — Z2089 Contact with and (suspected) exposure to other communicable diseases: Secondary | ICD-10-CM | POA: Diagnosis not present

## 2022-02-18 DIAGNOSIS — M25562 Pain in left knee: Secondary | ICD-10-CM

## 2022-02-18 NOTE — Addendum Note (Signed)
Addended by: Clayborne Dana on: 02/18/2022 08:38 AM   Modules accepted: Orders

## 2022-02-20 ENCOUNTER — Ambulatory Visit: Payer: 59

## 2022-02-20 ENCOUNTER — Other Ambulatory Visit: Payer: Self-pay

## 2022-02-20 DIAGNOSIS — G4733 Obstructive sleep apnea (adult) (pediatric): Secondary | ICD-10-CM | POA: Diagnosis not present

## 2022-02-20 LAB — LYME DISEASE ANTIBODY WITH REFLEX TO IMMUNOASSAY (IGG, IGM): LYME AB, SCREEN: <=0.9 Index (ref <=0.90)

## 2022-02-21 DIAGNOSIS — G4733 Obstructive sleep apnea (adult) (pediatric): Secondary | ICD-10-CM | POA: Diagnosis not present

## 2022-02-25 ENCOUNTER — Ambulatory Visit: Payer: 59 | Admitting: Family Medicine

## 2022-02-27 NOTE — Telephone Encounter (Signed)
Pt sent a mychart message requesting to know the results of recent sleep study. Beth, please advise. ?

## 2022-02-27 NOTE — Telephone Encounter (Signed)
HST on 02/21/22 showed that he has mild obstructive sleep apnea, AHI 11/hr with SpO2 low 86% (baseline 92%). He needs visit to review results in more detail and discuss treatment options. For now focus on side sleeping position, avoid alcohol/sedating medication at bedtime and do not drive if tired.

## 2022-03-05 ENCOUNTER — Encounter: Payer: Self-pay | Admitting: Emergency Medicine

## 2022-03-11 DIAGNOSIS — R69 Illness, unspecified: Secondary | ICD-10-CM | POA: Diagnosis not present

## 2022-03-15 ENCOUNTER — Other Ambulatory Visit: Payer: Self-pay

## 2022-03-15 ENCOUNTER — Telehealth (INDEPENDENT_AMBULATORY_CARE_PROVIDER_SITE_OTHER): Payer: 59 | Admitting: Primary Care

## 2022-03-15 DIAGNOSIS — G4730 Sleep apnea, unspecified: Secondary | ICD-10-CM

## 2022-03-15 NOTE — Progress Notes (Signed)
Virtual Visit via Video Note ? ?I connected with Lenard Galloway on 03/15/22 at  4:00 PM EDT by a video enabled telemedicine application and verified that I am speaking with the correct person using two identifiers. ? ?Location: ?Patient: Home ?Provider: Office  ?  ?I discussed the limitations of evaluation and management by telemedicine and the availability of in person appointments. The patient expressed understanding and agreed to proceed. ? ?History of Present Illness: ?56 year old, light smoker. PMH significant for exercise induced asthma, covid-19 x2. Maintained on prn albuterol.  ? ?Previous [facility] pulmonary encounter:  ?01/30/2022 ?Patient presents today for sleep consult. Referred by PCP for fatigue. He has symptoms of snoring and witnessed apnea. Patient had sleep study > 10 year ago which was borderline for OSA, he was never on CPAP. Typical bedtime is between 10-11:30 pm. It takes him on average 15-20 min to fall asleep. He wakes up 1-2 times at night. He starts his day around 7-8:30am. His weight is up 15 lbs. He has a family hx sleep apnea, his mother is on CPAP. No symptoms of cataplexy or narcolepsy. Epworth 9/24.  ? ?Sleep questionnaire  ?Symptoms: Snoring and witnessed apnea  ?Prior sleep study: > 10 year ago, never on CPAP  ?Bedtime: 10-11:30pm  ?Time to fall asleep: 15-8mins on average  ?Nocturnal awakenings: 1-2 times  ?Start of day: 7-8:30am  ?Weight changes: +15lbs  ?Epworth: 9/24 ? ? 03/15/2022- Interim hx  ?Patient contacted today to review sleep study results. He is doing well today. Currently living in [place] d.t his wife's job, most likely will be moving back to [place] or [place] area but unsure when. Home sleep test on 02/21/22 showed mild OSA, AHI 11.9/hr with SpO2 low 86% (basal 92%). We reviewed treatment options including weight loss, positional sleep therapy, oral appliance, CPAP or ENT referral. Due mild apnea and current living situation he has elected to focus on sleep  position and will consider oral appliance when he returns to [facility].  ? ?Observations/Objective: ? ?Appear well; No acute respiratory symptoms  ? ?HST 02/21/22>> Mild OSA, AHI 11.9/hr with SpO2 87% (basal O2 92%) ? ?Assessment and Plan: ? ?Mild OSA: ?- Patient has symptoms of snoring and witnessed apnea. HST 02/21/22>> AHI 11.9/hr with SpO2 87% (basal O2 92%). We reviewed risk of untreated sleep apnea and treatment options including weight loss, positional sleep therapy, oral appliance, CPAP or ENT referral. Due mild apnea and current living situation recommend focusing on sleep position and consider oral appliance when he returns to [place]. I will provide patient with contact information for Dr. Toni Arthurs and Dr. Myrtis Ser. He will notify us if he needs referral placed and sleep study sent to either of them or another orthodontist in [place].  ? ?Recommendations: ?Focus on side sleeping position ?Work on weight loss efforts/ maintain normal BMI range  ?Do not drive if experiencing excessive daytime sleepiness of fatigue  ? ?Follow Up Instructions: ?6 months with Beth Np ?  ?I discussed the assessment and treatment plan with the patient. The patient was provided an opportunity to ask questions and all were answered. The patient agreed with the plan and demonstrated an understanding of the instructions. ?  ?The patient was advised to call back or seek an in-person evaluation if the symptoms worsen or if the condition fails to improve as anticipated. ? ?I provided 22 minutes of non-face-to-face time during this encounter. ? ? ?Glenford Bayley, NP ? ?

## 2022-03-15 NOTE — Patient Instructions (Addendum)
Home sleep study 02/21/22>> Mild OSA, AHI 11.9/hr with SpO2 87% (basal O2 92%) ? ?Treatment options including weight loss, positional sleep therapy, oral appliance, CPAP or ENT referral for surgical options. Due mild apnea and current living situation recommend focusing on sleep position and consider oral appliance when you return to [place] ? ?Local [place] orthodontist who specialize in OSA treatment with oral appliance are Dr. Toy Cookey [phone] and Dr. Ron Parker [phone]. If you decide youd like to see one of them we will need to place a referral and fax them a copy of your sleep study  ? ?Recommendations: ?Focus on side sleeping position ?Work on weight loss efforts/ maintain normal BMI range  ?Do not drive if experiencing excessive daytime sleepiness of fatigue  ? ?Follow-up: ?Recall placed for 6 month follow-up with Eustaquio Maize NP  ?

## 2022-03-16 NOTE — Progress Notes (Signed)
Reviewed and agree with assessment/plan. ? ? ?Hadlei Stitt, MD ?[facility] ?03/16/2022, 7:10 AM ?Pager:  [phone] ? ?

## 2022-03-26 DIAGNOSIS — R69 Illness, unspecified: Secondary | ICD-10-CM | POA: Diagnosis not present

## 2022-04-24 DIAGNOSIS — R69 Illness, unspecified: Secondary | ICD-10-CM | POA: Diagnosis not present

## 2022-05-21 DIAGNOSIS — R69 Illness, unspecified: Secondary | ICD-10-CM | POA: Diagnosis not present

## 2022-07-05 ENCOUNTER — Encounter: Payer: Self-pay | Admitting: Family Medicine

## 2022-07-09 ENCOUNTER — Other Ambulatory Visit: Payer: Self-pay | Admitting: *Deleted

## 2022-07-09 DIAGNOSIS — R69 Illness, unspecified: Secondary | ICD-10-CM | POA: Diagnosis not present

## 2022-07-09 MED ORDER — CHOLESTYRAMINE 4 GM/DOSE PO POWD: 4.0000 g | Freq: Every day | ORAL | 0 refills | Status: DC

## 2022-08-13 DIAGNOSIS — R69 Illness, unspecified: Secondary | ICD-10-CM | POA: Diagnosis not present

## 2022-08-27 DIAGNOSIS — R69 Illness, unspecified: Secondary | ICD-10-CM | POA: Diagnosis not present

## 2022-09-09 NOTE — Telephone Encounter (Signed)
I do not have a personal contact but was able to find a paticed that may be able to help him. He will need to call and see if they treat sleep apnea and if he needs a referral we can place one  [facility], [address] [place], [place] [address]  [place]

## 2022-09-09 NOTE — Telephone Encounter (Signed)
Beth please advise.  

## 2022-10-02 DIAGNOSIS — R69 Illness, unspecified: Secondary | ICD-10-CM | POA: Diagnosis not present

## 2022-11-12 DIAGNOSIS — R69 Illness, unspecified: Secondary | ICD-10-CM | POA: Diagnosis not present

## 2022-11-27 ENCOUNTER — Other Ambulatory Visit: Payer: Self-pay | Admitting: Family Medicine

## 2022-12-05 DIAGNOSIS — G4730 Sleep apnea, unspecified: Secondary | ICD-10-CM

## 2022-12-10 DIAGNOSIS — R69 Illness, unspecified: Secondary | ICD-10-CM | POA: Diagnosis not present

## 2022-12-25 ENCOUNTER — Other Ambulatory Visit: Payer: Self-pay | Admitting: Family Medicine

## 2023-04-07 ENCOUNTER — Encounter: Payer: Self-pay | Admitting: *Deleted

## 2023-05-12 ENCOUNTER — Other Ambulatory Visit: Payer: Self-pay | Admitting: Family Medicine

## 2023-05-16 ENCOUNTER — Encounter: Payer: Self-pay | Admitting: Family Medicine

## 2023-05-16 DIAGNOSIS — J45909 Unspecified asthma, uncomplicated: Secondary | ICD-10-CM

## 2023-05-16 NOTE — Telephone Encounter (Signed)
Patient has not been seen in over a year. Okay to place referral?

## 2024-01-23 ENCOUNTER — Other Ambulatory Visit: Payer: Self-pay | Admitting: Family Medicine

## 2024-05-01 ENCOUNTER — Other Ambulatory Visit: Payer: Self-pay | Admitting: Family Medicine

## 2024-05-05 ENCOUNTER — Other Ambulatory Visit: Payer: Self-pay | Admitting: Family Medicine

## 2024-08-13 ENCOUNTER — Other Ambulatory Visit: Payer: Self-pay | Admitting: Family Medicine
